# Patient Record
Sex: Female | Born: 1945 | Race: Black or African American | Hispanic: No | Smoking: Former smoker
Health system: Southern US, Community
[De-identification: ages and names within clinical notes are randomized; demographics above are authoritative.]

## PROBLEM LIST (undated history)

## (undated) DIAGNOSIS — M1990 Unspecified osteoarthritis, unspecified site: Secondary | ICD-10-CM

## (undated) DIAGNOSIS — J45909 Unspecified asthma, uncomplicated: Secondary | ICD-10-CM

## (undated) DIAGNOSIS — I10 Essential (primary) hypertension: Secondary | ICD-10-CM

## (undated) DIAGNOSIS — Z5189 Encounter for other specified aftercare: Secondary | ICD-10-CM

## (undated) DIAGNOSIS — E119 Type 2 diabetes mellitus without complications: Secondary | ICD-10-CM

## (undated) HISTORY — PX: APPENDECTOMY: SHX54

## (undated) HISTORY — PX: CHOLECYSTECTOMY: SHX55

## (undated) HISTORY — PX: HAND SURGERY: SHX662

## (undated) HISTORY — PX: KNEE SURGERY: SHX244

## (undated) HISTORY — PX: ABDOMINAL HYSTERECTOMY: SHX81

---

## 2007-02-06 ENCOUNTER — Emergency Department: Payer: Self-pay

## 2014-05-20 ENCOUNTER — Emergency Department (HOSPITAL_BASED_OUTPATIENT_CLINIC_OR_DEPARTMENT_OTHER): Payer: Medicare Other

## 2014-05-20 ENCOUNTER — Encounter (HOSPITAL_BASED_OUTPATIENT_CLINIC_OR_DEPARTMENT_OTHER): Payer: Self-pay | Admitting: *Deleted

## 2014-05-20 ENCOUNTER — Emergency Department (HOSPITAL_BASED_OUTPATIENT_CLINIC_OR_DEPARTMENT_OTHER)
Admission: EM | Admit: 2014-05-20 | Discharge: 2014-05-20 | Disposition: A | Discharge status: Home / Self Care | Payer: Medicare Other | Attending: Emergency Medicine | Admitting: Emergency Medicine

## 2014-05-20 DIAGNOSIS — Z9889 Other specified postprocedural states: Secondary | ICD-10-CM | POA: Insufficient documentation

## 2014-05-20 DIAGNOSIS — I10 Essential (primary) hypertension: Secondary | ICD-10-CM | POA: Diagnosis not present

## 2014-05-20 DIAGNOSIS — J45909 Unspecified asthma, uncomplicated: Secondary | ICD-10-CM | POA: Diagnosis not present

## 2014-05-20 DIAGNOSIS — Z7901 Long term (current) use of anticoagulants: Secondary | ICD-10-CM | POA: Diagnosis not present

## 2014-05-20 DIAGNOSIS — M25562 Pain in left knee: Secondary | ICD-10-CM | POA: Diagnosis present

## 2014-05-20 DIAGNOSIS — Z79899 Other long term (current) drug therapy: Secondary | ICD-10-CM | POA: Diagnosis not present

## 2014-05-20 DIAGNOSIS — Z87891 Personal history of nicotine dependence: Secondary | ICD-10-CM | POA: Insufficient documentation

## 2014-05-20 DIAGNOSIS — R52 Pain, unspecified: Secondary | ICD-10-CM

## 2014-05-20 DIAGNOSIS — M1712 Unilateral primary osteoarthritis, left knee: Secondary | ICD-10-CM

## 2014-05-20 DIAGNOSIS — M1990 Unspecified osteoarthritis, unspecified site: Secondary | ICD-10-CM | POA: Insufficient documentation

## 2014-05-20 HISTORY — DX: Encounter for other specified aftercare: Z51.89

## 2014-05-20 HISTORY — DX: Unspecified asthma, uncomplicated: J45.909

## 2014-05-20 HISTORY — DX: Essential (primary) hypertension: I10

## 2014-05-20 HISTORY — DX: Unspecified osteoarthritis, unspecified site: M19.90

## 2014-05-20 MED ORDER — METHOCARBAMOL 500 MG PO TABS: 500.0000 mg | ORAL_TABLET | Freq: Two times a day (BID) | ORAL | Status: DC | PRN

## 2014-05-20 MED ORDER — ONDANSETRON 4 MG PO TBDP
ORAL_TABLET | ORAL | Status: AC
  Administered 2014-05-20: 4 mg via ORAL
  Filled 2014-05-20: qty 1

## 2014-05-20 MED ORDER — ONDANSETRON 4 MG PO TBDP
4.0000 mg | ORAL_TABLET | Freq: Once | ORAL | Status: AC
  Administered 2014-05-20: 4 mg via ORAL

## 2014-05-20 MED ORDER — HYDROMORPHONE HCL 1 MG/ML IJ SOLN
1.0000 mg | Freq: Once | INTRAMUSCULAR | Status: AC
  Administered 2014-05-20: 1 mg via INTRAMUSCULAR
  Filled 2014-05-20: qty 1

## 2014-05-20 NOTE — Discharge Instructions (Signed)
Please call your doctor for a followup appointment within 24-48 hours. When you talk to your doctor please let them know that you were seen in the emergency department and have them acquire all of your records so that they can discuss the findings with you and formulate a treatment plan to fully care for your new and ongoing problems. ° °

## 2014-05-20 NOTE — ED Notes (Signed)
D/c home with family- directed to pharmacy to pick up prescriptions

## 2014-05-20 NOTE — ED Notes (Signed)
MD at bedside. 

## 2014-05-20 NOTE — ED Notes (Signed)
Patient assisted into bed.

## 2014-05-20 NOTE — ED Notes (Signed)
Left knee scope surgery on March 3- reports pain has persisted since post-op day 3- pt states her surgeon told her to get a nerve block- pt attempted to follow through with this and was told that wasn't what she needed. Pt reports she wanted to "saw her leg off last night"- Surgery was in [place]

## 2014-05-20 NOTE — ED Notes (Signed)
Patient transported to X-ray 

## 2014-05-20 NOTE — ED Provider Notes (Signed)
CSN: [id number]     Arrival date & time 05/20/14  [id number] History   First MD Initiated Contact with Patient 05/20/14 [phone]     Chief Complaint  Patient presents with  . Knee Pain     (Consider location/radiation/quality/duration/timing/severity/associated sxs/prior Treatment) HPI Comments: The patient is a 69 year old female, she is approximately 1 month postop from an arthroscopic surgery of her left knee where she was told that she had meniscus injuries and this was partially resected according to the patient's report. Ever since 3 days after surgery she has had ongoing pain in that knee, it is particularly intense when she tries to change position including getting up to walk or walking. There is minimal pain when she is at rest. The pain is located behind the knee and radiates anteriorly to just below the patella. 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 She denies fevers chills nausea vomiting redness or swelling of the knee. Her orthopedic surgeon is in [place]. She has follow-up in 8 days  Patient is a 69 y.o. female presenting with knee pain. The history is provided by the patient.  Knee Pain   Past Medical History  Diagnosis Date  . Hypertension   . Arthritis   . Asthma   . Blood transfusion without reported diagnosis    Past Surgical History  Procedure Laterality Date  . Knee surgery Bilateral   . Abdominal hysterectomy    . Cholecystectomy    . Appendectomy    . Hand surgery     No family history on file. History  Substance Use Topics  . Smoking status: Former Research scientist (life sciences)  . Smokeless tobacco: Never Used  . Alcohol Use: No   OB History    No data available     Review of Systems  All other systems reviewed and are negative.     Allergies  Actonel; Codeine; and  Talwin  Home Medications   Prior to Admission medications   Medication Sig Start Date End Date Taking? Authorizing Provider  amLODipine (NORVASC) 5 MG tablet Take 5 mg by mouth daily.   Yes Historical Provider, MD  atorvastatin (LIPITOR) 10 MG tablet Take 10 mg by mouth daily.   Yes Historical Provider, MD  Calcium Carbonate-Vitamin D (CALCIUM + D PO) Take by mouth.   Yes Historical Provider, MD  cetirizine (ZYRTEC) 10 MG tablet Take 10 mg by mouth daily.   Yes Historical Provider, MD  chlorzoxazone (PARAFON) 500 MG tablet Take 500 mg by mouth 4 (four) times daily as needed for muscle spasms.   Yes Historical Provider, MD  cholecalciferol (VITAMIN D) 1000 UNITS tablet Take 1,000 Units by mouth daily.   Yes Historical Provider, MD  cyclobenzaprine (FLEXERIL) 10 MG tablet Take 10 mg by mouth 3 (three) times daily as needed for muscle spasms.   Yes Historical Provider, MD  docusate sodium (COLACE) 100 MG capsule Take 100 mg by mouth 2 (two) times daily.   Yes Historical Provider, MD  gabapentin (NEURONTIN) 300 MG capsule Take 300 mg by mouth 3 (three) times daily.   Yes Historical Provider, MD  IRON PO Take 65 mg by mouth.   Yes Historical Provider, MD  methylPREDNISolone (MEDROL) 4 MG tablet Take 4 mg by mouth daily.   Yes Historical Provider, MD  Multiple Vitamin (  MULTIVITAMIN) tablet Take 1 tablet by mouth daily.   Yes Historical Provider, MD  oxyCODONE-acetaminophen (PERCOCET/ROXICET) 5-325 MG per tablet Take 2 tablets by mouth every 6 (six) hours as needed for severe pain.   Yes Historical Provider, MD  pantoprazole (PROTONIX) 40 MG tablet Take 40 mg by mouth daily.   Yes Historical Provider, MD  valsartan-hydrochlorothiazide (DIOVAN-HCT) 160-25 MG per tablet Take 1 tablet by mouth daily.   Yes Historical Provider, MD  warfarin (COUMADIN) 5 MG tablet Take 5 mg by mouth daily.   Yes Historical Provider, MD  methocarbamol (ROBAXIN) 500 MG tablet Take 1 tablet (500 mg total) by mouth 2 (two)  times daily as needed for muscle spasms. 05/20/14   Noemi Chapel, MD   BP 140/84 mmHg  Pulse 74  Temp(Src) 98.2 F (36.8 C) (Oral)  Resp 20  Ht 5\' 2"  (1.575 m)  Wt 250 lb (113.399 kg)  BMI 45.71 kg/m2  SpO2 99% Physical Exam  Constitutional: She appears well-developed and well-nourished. No distress.  HENT:  Head: Normocephalic and atraumatic.  Mouth/Throat: Oropharynx is clear and moist. No oropharyngeal exudate.  Eyes: Conjunctivae and EOM are normal. Pupils are equal, round, and reactive to light. Right eye exhibits no discharge. Left eye exhibits no discharge. No scleral icterus.  Neck: Normal range of motion. Neck supple. No JVD present. No thyromegaly present.  Cardiovascular: Normal rate, regular rhythm, normal heart sounds and intact distal pulses.  Exam reveals no gallop and no friction rub.   No murmur heard. Pulmonary/Chest: Effort normal and breath sounds normal. No respiratory distress. She has no wheezes. She has no rales.  Abdominal: Soft. Bowel sounds are normal. She exhibits no distension and no mass. There is no tenderness.  Musculoskeletal: Normal range of motion. She exhibits tenderness ( Minimal pain until the patient has to straight leg raise and bend her knee spontaneously. There is no crepitance palpated). She exhibits no edema.  No tenderness over the patella, the patellar tendon or the distal quadriceps. Able to straight leg raise, no tenderness over the calf, no asymmetry of the lower extremities, no warmth redness or obvious joint effusion  Lymphadenopathy:    She has no cervical adenopathy.  Neurological: She is alert. Coordination normal.  Skin: Skin is warm and dry. No rash noted. No erythema.  Psychiatric: She has a normal mood and affect. Her behavior is normal.  Nursing note and vitals reviewed.   ED Course  Procedures (including critical care time) Labs Review Labs Reviewed - No data to display  Imaging Review Dg Knee Complete 4 Views  Left  05/20/2014   CLINICAL DATA:  Persistent pain since postoperative day 3 from LEFT knee arthroscopy on 04/18/2014  EXAM: LEFT KNEE - COMPLETE 4+ VIEW  COMPARISON:  None  FINDINGS: Osseous demineralization.  Tricompartmental osteoarthritic changes with joint space narrowing and spur formation.  Mild lateral subluxation of tibia.  No acute fracture, dislocation, or bone destruction.  No knee joint effusion.  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   Electronically Signed   By: Lavonia Dana M.D.   On: 05/20/2014 10:10     MDM   Final diagnoses:  Pain  Arthritis of left knee    The patient does have decreased range of motion secondary to pain, she does not appear to have a septic joint, vital signs are normal, pain has been present for over 3 weeks, image, intramuscular Dilaudid 1, can follow up with surgeon if negative, likely needs repeat orthopedic evaluation.  Significant arthritis but no other acute findings, patient stable for discharge on her medications that she is already taking. Vital signs normal, doubt septic joint  Meds given in ED:  Medications  HYDROmorphone (DILAUDID) injection 1 mg (1 mg Intramuscular Given 05/20/14 0940)  ondansetron (ZOFRAN-ODT) disintegrating tablet 4 mg (4 mg Oral Given 05/20/14 0937)    New Prescriptions   METHOCARBAMOL (ROBAXIN) 500 MG TABLET    Take 1 tablet (500 mg total) by mouth 2 (two) times daily as needed for muscle spasms.      Noemi Chapel, MD 05/20/14 1022

## 2014-05-20 NOTE — ED Notes (Signed)
Patient to triage via wheelchair, EMT asked multiple visitors to remain in lobby at this time.

## 2015-10-24 DIAGNOSIS — D509 Iron deficiency anemia, unspecified: Secondary | ICD-10-CM | POA: Insufficient documentation

## 2015-10-24 DIAGNOSIS — R011 Cardiac murmur, unspecified: Secondary | ICD-10-CM | POA: Insufficient documentation

## 2015-12-02 DIAGNOSIS — Z7901 Long term (current) use of anticoagulants: Secondary | ICD-10-CM | POA: Insufficient documentation

## 2015-12-02 DIAGNOSIS — Z86711 Personal history of pulmonary embolism: Secondary | ICD-10-CM | POA: Insufficient documentation

## 2015-12-23 DIAGNOSIS — M4727 Other spondylosis with radiculopathy, lumbosacral region: Secondary | ICD-10-CM | POA: Insufficient documentation

## 2015-12-23 DIAGNOSIS — G8929 Other chronic pain: Secondary | ICD-10-CM | POA: Insufficient documentation

## 2016-06-01 DIAGNOSIS — Z86010 Personal history of colonic polyps: Secondary | ICD-10-CM | POA: Insufficient documentation

## 2016-06-27 ENCOUNTER — Encounter (HOSPITAL_COMMUNITY): Payer: Self-pay

## 2016-06-27 ENCOUNTER — Emergency Department (HOSPITAL_COMMUNITY): Payer: Medicare Other

## 2016-06-27 ENCOUNTER — Emergency Department (HOSPITAL_COMMUNITY)
Admission: EM | Admit: 2016-06-27 | Discharge: 2016-06-28 | Disposition: A | Discharge status: Home / Self Care | Payer: Medicare Other | Attending: Emergency Medicine | Admitting: Emergency Medicine

## 2016-06-27 DIAGNOSIS — J45909 Unspecified asthma, uncomplicated: Secondary | ICD-10-CM | POA: Insufficient documentation

## 2016-06-27 DIAGNOSIS — R1010 Upper abdominal pain, unspecified: Secondary | ICD-10-CM | POA: Insufficient documentation

## 2016-06-27 DIAGNOSIS — Z79899 Other long term (current) drug therapy: Secondary | ICD-10-CM | POA: Diagnosis not present

## 2016-06-27 DIAGNOSIS — I10 Essential (primary) hypertension: Secondary | ICD-10-CM | POA: Insufficient documentation

## 2016-06-27 DIAGNOSIS — R079 Chest pain, unspecified: Secondary | ICD-10-CM | POA: Diagnosis present

## 2016-06-27 DIAGNOSIS — Z87891 Personal history of nicotine dependence: Secondary | ICD-10-CM | POA: Insufficient documentation

## 2016-06-27 LAB — I-STAT TROPONIN, ED: Troponin i, poc: 0.02 ng/mL (ref 0.00–0.08)

## 2016-06-27 LAB — HEPATIC FUNCTION PANEL
ALK PHOS: 51 U/L (ref 38–126)
ALT: 14 U/L (ref 14–54)
AST: 20 U/L (ref 15–41)
Albumin: 3.5 g/dL (ref 3.5–5.0)
BILIRUBIN TOTAL: 0.3 mg/dL (ref 0.3–1.2)
TOTAL PROTEIN: 7.2 g/dL (ref 6.5–8.1)

## 2016-06-27 LAB — BASIC METABOLIC PANEL
Anion gap: 11 (ref 5–15)
BUN: 36 mg/dL — ABNORMAL HIGH (ref 6–20)
CHLORIDE: 110 mmol/L (ref 101–111)
CO2: 22 mmol/L (ref 22–32)
Calcium: 9 mg/dL (ref 8.9–10.3)
Creatinine, Ser: 1.86 mg/dL — ABNORMAL HIGH (ref 0.44–1.00)
GFR calc Af Amer: 30 mL/min — ABNORMAL LOW (ref >60)
GFR calc non Af Amer: 26 mL/min — ABNORMAL LOW (ref >60)
Glucose, Bld: 128 mg/dL — ABNORMAL HIGH (ref 65–99)
Potassium: 3.3 mmol/L — ABNORMAL LOW (ref 3.5–5.1)
Sodium: 143 mmol/L (ref 135–145)

## 2016-06-27 LAB — CBC
HCT: 33.5 % — ABNORMAL LOW (ref 36.0–46.0)
Hemoglobin: 10.5 g/dL — ABNORMAL LOW (ref 12.0–15.0)
MCH: 21.6 pg — ABNORMAL LOW (ref 26.0–34.0)
MCHC: 31.3 g/dL (ref 30.0–36.0)
MCV: 68.8 fL — AB (ref 78.0–100.0)
PLATELETS: 282 10*3/uL (ref 150–400)
RBC: 4.87 MIL/uL (ref 3.87–5.11)
RDW: 18.2 % — AB (ref 11.5–15.5)
WBC: 7.8 10*3/uL (ref 4.0–10.5)

## 2016-06-27 LAB — LIPASE, BLOOD: LIPASE: 27 U/L (ref 11–51)

## 2016-06-27 MED ORDER — HYDROMORPHONE HCL 1 MG/ML IJ SOLN: 1.0000 mg | Freq: Once | INTRAMUSCULAR | Status: DC

## 2016-06-27 MED ORDER — ONDANSETRON 4 MG PO TBDP: 4.0000 mg | ORAL_TABLET | Freq: Once | ORAL | Status: DC

## 2016-06-27 MED ORDER — HYDROCODONE-ACETAMINOPHEN 5-325 MG PO TABS: 1.0000 | ORAL_TABLET | Freq: Four times a day (QID) | ORAL | 0 refills | Status: DC | PRN

## 2016-06-27 NOTE — ED Notes (Signed)
Looked and appropriate tube in lab to add on function test

## 2016-06-27 NOTE — ED Notes (Signed)
Unable to find vein to stick for blood draw/ IV start

## 2016-06-27 NOTE — ED Notes (Signed)
Sent add on label to lab for add on blood test

## 2016-06-27 NOTE — Discharge Instructions (Signed)
Increase her Protonix to twice a day. Call your family doctor and find out when you're somewhat with the GI doctors going to be

## 2016-06-27 NOTE — ED Triage Notes (Signed)
Pt from home with epi gastric pain that started today. Pt was seen by PCP and referred to GI MD but has not seen one yet. Pt rates PAIN 6-10 and is oriented x4. no SOB.

## 2016-06-27 NOTE — ED Notes (Signed)
MD cleared patient to have water. Water provided

## 2016-06-27 NOTE — ED Notes (Signed)
Pt at Xray, phlebotomist came to draw labs.

## 2016-06-28 NOTE — ED Notes (Signed)
Pt walked out of the room and RN asked patient if she wanted her discharge paper work. Pt stated "you can mail it to me." RN also informed patient and family that she had pain medication ordered. She continued to walk

## 2016-06-28 NOTE — ED Notes (Signed)
Pt left prior to RN reviewing them. RN informed family and patient as they were leaving they had pain and nausea medication ordered. Pt stated we could mail the papers to her and she kept walking

## 2016-06-28 NOTE — ED Provider Notes (Signed)
[facility] DEPT Provider Note   CSN: [id number] Arrival date & time: 06/27/16  1759     History   Chief Complaint Chief Complaint  Patient presents with  . Chest Pain    HPI Katrina Brady is a 71 y.o. female.  Patient complains of epigastric abdominal pain   The history is provided by the patient.  Chest Pain   This is a recurrent problem. The current episode started more than 1 week ago. The problem occurs constantly. The problem has not changed since onset.The pain is associated with rest. The pain is present in the epigastric region. The pain is at a severity of 6/10. The pain is moderate. The quality of the pain is described as burning. Pertinent negatives include no abdominal pain, no back pain, no cough and no headaches.  Pertinent negatives for past medical history include no seizures.    Past Medical History:  Diagnosis Date  . Arthritis   . Asthma   . Blood transfusion without reported diagnosis   . Hypertension     There are no active problems to display for this patient.   Past Surgical History:  Procedure Laterality Date  . ABDOMINAL HYSTERECTOMY    . APPENDECTOMY    . CHOLECYSTECTOMY    . HAND SURGERY    . KNEE SURGERY Bilateral     OB History    No data available       Home Medications    Prior to Admission medications   Medication Sig Start Date End Date Taking? Authorizing Provider  acetaminophen (TYLENOL) 500 MG tablet Take 1,000 mg by mouth every 6 (six) hours as needed for headache (pain).   Yes [provider]  amLODipine (NORVASC) 5 MG tablet Take 5 mg by mouth daily.   Yes [provider]  Bioflavonoid Products (VITAMIN C) CHEW Chew 2 tablets by mouth daily at 3 pm.   Yes [provider]  Calcium Carb-Cholecalciferol (CALCIUM 600-D PO) Take 600 mg by mouth 2 (two) times daily.   Yes [provider]  cetirizine (ZYRTEC) 10 MG tablet Take 10 mg by mouth at bedtime.    Yes [provider]    Cholecalciferol (VITAMIN D-3) 5000 units TABS Take 5,000 Units by mouth daily.   Yes [provider]  docusate sodium (COLACE) 100 MG capsule Take 100 mg by mouth at bedtime.    Yes [provider]  ferrous sulfate 325 (65 FE) MG tablet Take 325 mg by mouth See admin instructions. Take 1 tablet (325 mg) by mouth Monday, Wednesday, Friday - mid afternoon (3pm)   Yes [provider]  Multiple Vitamin (MULTIVITAMIN WITH MINERALS) TABS tablet Take 1 tablet by mouth daily.   Yes [provider]  pantoprazole (PROTONIX) 40 MG tablet Take 40 mg by mouth daily before breakfast.    Yes [provider]  potassium chloride SA (K-DUR,KLOR-CON) 20 MEQ tablet Take 20 mEq by mouth at bedtime.   Yes [provider]  Probiotic Product (RA PROBIOTIC GUMMIES PO) Take 2 tablets by mouth daily at 12 noon.   Yes [provider]  rosuvastatin (CRESTOR) 40 MG tablet Take 40 mg by mouth daily. 06/19/16  Yes [provider]  traMADol (ULTRAM) 50 MG tablet Take 50 mg by mouth 3 (three) times daily as needed (pain).  06/22/16  Yes [provider]  valsartan-hydrochlorothiazide (DIOVAN-HCT) 320-25 MG tablet Take 1 tablet by mouth daily. 06/17/16  Yes [provider]  warfarin (COUMADIN) 5 MG tablet  Take 5-7.5 mg by mouth See admin instructions. Take 1 1/2 tablets (7.5 mg) by mouth Tuesday and Thursday after supper, take 1 tablet (5 mg) on Sunday, Monday, Wednesday, Friday, Saturday after supper   Yes [provider]  HYDROcodone-acetaminophen (NORCO/VICODIN) 5-325 MG tablet Take 1 tablet by mouth every 6 (six) hours as needed for moderate pain. 06/27/16   Milton Ferguson, MD  methocarbamol (ROBAXIN) 500 MG tablet Take 1 tablet (500 mg total) by mouth 2 (two) times daily as needed for muscle spasms. Patient not taking: Reported on 06/27/2016 05/20/14   Noemi Chapel, MD    Family History History reviewed. No pertinent family  history.  Social History Social History  Substance Use Topics  . Smoking status: Former Research scientist (life sciences)  . Smokeless tobacco: Never Used  . Alcohol use No     Allergies   Actonel [risedronate sodium]; Codeine; Meloxicam; and Talwin [pentazocine]   Review of Systems Review of Systems  Constitutional: Negative for appetite change and fatigue.  HENT: Negative for congestion, ear discharge and sinus pressure.   Eyes: Negative for discharge.  Respiratory: Negative for cough.   Cardiovascular: Positive for chest pain.  Gastrointestinal: Negative for abdominal pain and diarrhea.  Genitourinary: Negative for frequency and hematuria.  Musculoskeletal: Negative for back pain.  Skin: Negative for rash.  Neurological: Negative for seizures and headaches.  Psychiatric/Behavioral: Negative for hallucinations.     Physical Exam Updated Vital Signs BP 118/71 (BP Location: Right Arm)   Pulse 72   Temp 97.9 F (36.6 C) (Oral)   Resp 16   Ht 5\' 3"  (1.6 m)   Wt 273 lb (123.8 kg)   SpO2 100%   BMI 48.36 kg/m   Physical Exam  Constitutional: She is oriented to person, place, and time. She appears well-developed.  HENT:  Head: Normocephalic.  Eyes: Conjunctivae and EOM are normal. No scleral icterus.  Neck: Neck supple. No thyromegaly present.  Cardiovascular: Normal rate and regular rhythm.  Exam reveals no gallop and no friction rub.   No murmur heard. Pulmonary/Chest: No stridor. She has no wheezes. She has no rales. She exhibits no tenderness.  Abdominal: She exhibits no distension. There is tenderness. There is no rebound.  Musculoskeletal: Normal range of motion. She exhibits no edema.  Lymphadenopathy:    She has no cervical adenopathy.  Neurological: She is oriented to person, place, and time. She exhibits normal muscle tone. Coordination normal.  Skin: No rash noted. No erythema.  Psychiatric: She has a normal mood and affect. Her behavior is normal.     ED Treatments /  Results  Labs (all labs ordered are listed, but only abnormal results are displayed) Labs Reviewed  BASIC METABOLIC PANEL - Abnormal; Notable for the following:       Result Value   Potassium 3.3 (*)    Glucose, Bld 128 (*)    BUN 36 (*)    Creatinine, Ser 1.86 (*)    GFR calc non Af Amer 26 (*)    GFR calc Af Amer 30 (*)    All other components within normal limits  CBC - Abnormal; Notable for the following:    Hemoglobin 10.5 (*)    HCT 33.5 (*)    MCV 68.8 (*)    MCH 21.6 (*)    RDW 18.2 (*)    All other components within normal limits  HEPATIC FUNCTION PANEL - Abnormal; Notable for the following:    Bilirubin, Direct <0.1 (*)    All other components  within normal limits  LIPASE, BLOOD  I-STAT TROPOININ, ED    EKG  EKG Interpretation  Date/Time:  Sunday Jun 27 2016 18:07:26 EDT Ventricular Rate:  84 PR Interval:    QRS Duration: 119 QT Interval:  391 QTC Calculation: 463 R Axis:   -21 Text Interpretation:  Sinus rhythm Probable left atrial enlargement Left ventricular hypertrophy Confirmed by Akeelah Seppala  MD, Broadus John [phone]) on 06/27/2016 7:01:14 PM Also confirmed by Elizar Alpern  MD, Broadus John [phone]), editor Drema Pry [phone])  on 06/28/2016 7:31:48 AM       Radiology Dg Chest 2 View  Result Date: 06/27/2016 CLINICAL DATA:  Initial evaluation for acute chest pain. EXAM: CHEST  2 VIEW COMPARISON:  Prior radiograph from 02/16/2010. FINDINGS: The cardiac and mediastinal silhouettes are stable in size and contour, and remain within normal limits. The lungs are normally inflated. No airspace consolidation, pleural effusion, or pulmonary edema is identified. There is no pneumothorax. No acute osseous abnormality identified. IMPRESSION: No active cardiopulmonary disease. Electronically Signed   By: Jeannine Boga M.D.   On: 06/27/2016 19:09    Procedures Procedures (including critical care time)  Medications Ordered in ED Medications - No data to display   Initial  Impression / Assessment and Plan / ED Course  I have reviewed the triage vital signs and the nursing notes.  Pertinent labs & imaging results that were available during my care of the patient were reviewed by me and considered in my medical decision making (see chart for details).     Suspect abdominal pain is related to peptic ulcer problems. She is going to increase her protein pump inhibitor and she will be followed up by GI  Final Clinical Impressions(s) / ED Diagnoses   Final diagnoses:  Pain of upper abdomen    New Prescriptions Discharge Medication List as of 06/27/2016 11:45 PM    START taking these medications   Details  HYDROcodone-acetaminophen (NORCO/VICODIN) 5-325 MG tablet Take 1 tablet by mouth every 6 (six) hours as needed for moderate pain., Starting Sun 06/27/2016, Print         Milton Ferguson, MD 06/28/16 [phone]

## 2016-11-29 DIAGNOSIS — R1013 Epigastric pain: Secondary | ICD-10-CM | POA: Insufficient documentation

## 2016-11-29 DIAGNOSIS — I82503 Chronic embolism and thrombosis of unspecified deep veins of lower extremity, bilateral: Secondary | ICD-10-CM | POA: Insufficient documentation

## 2016-11-30 DIAGNOSIS — N1832 Chronic kidney disease, stage 3b: Secondary | ICD-10-CM | POA: Insufficient documentation

## 2016-12-16 ENCOUNTER — Encounter: Payer: Self-pay | Admitting: Emergency Medicine

## 2016-12-16 ENCOUNTER — Emergency Department: Payer: Medicare Other

## 2016-12-16 ENCOUNTER — Emergency Department
Admission: EM | Admit: 2016-12-16 | Discharge: 2016-12-16 | Disposition: A | Discharge status: Home / Self Care | Payer: Medicare Other | Attending: Emergency Medicine | Admitting: Emergency Medicine

## 2016-12-16 DIAGNOSIS — Z7901 Long term (current) use of anticoagulants: Secondary | ICD-10-CM | POA: Insufficient documentation

## 2016-12-16 DIAGNOSIS — R110 Nausea: Secondary | ICD-10-CM | POA: Insufficient documentation

## 2016-12-16 DIAGNOSIS — R059 Cough, unspecified: Secondary | ICD-10-CM

## 2016-12-16 DIAGNOSIS — I10 Essential (primary) hypertension: Secondary | ICD-10-CM | POA: Insufficient documentation

## 2016-12-16 DIAGNOSIS — Z87891 Personal history of nicotine dependence: Secondary | ICD-10-CM | POA: Insufficient documentation

## 2016-12-16 DIAGNOSIS — R05 Cough: Secondary | ICD-10-CM | POA: Diagnosis not present

## 2016-12-16 DIAGNOSIS — R079 Chest pain, unspecified: Secondary | ICD-10-CM | POA: Insufficient documentation

## 2016-12-16 DIAGNOSIS — Z79899 Other long term (current) drug therapy: Secondary | ICD-10-CM | POA: Diagnosis not present

## 2016-12-16 DIAGNOSIS — J45909 Unspecified asthma, uncomplicated: Secondary | ICD-10-CM | POA: Diagnosis not present

## 2016-12-16 LAB — BASIC METABOLIC PANEL
ANION GAP: 12 (ref 5–15)
BUN: 32 mg/dL — ABNORMAL HIGH (ref 6–20)
CHLORIDE: 107 mmol/L (ref 101–111)
CO2: 20 mmol/L — AB (ref 22–32)
CREATININE: 1.59 mg/dL — AB (ref 0.44–1.00)
Calcium: 9.5 mg/dL (ref 8.9–10.3)
GFR calc non Af Amer: 31 mL/min — ABNORMAL LOW (ref >60)
GFR, EST AFRICAN AMERICAN: 36 mL/min — AB (ref >60)
Glucose, Bld: 103 mg/dL — ABNORMAL HIGH (ref 65–99)
POTASSIUM: 4.2 mmol/L (ref 3.5–5.1)
SODIUM: 139 mmol/L (ref 135–145)

## 2016-12-16 LAB — HEPATIC FUNCTION PANEL
ALBUMIN: 4 g/dL (ref 3.5–5.0)
ALK PHOS: 55 U/L (ref 38–126)
ALT: 17 U/L (ref 14–54)
AST: 25 U/L (ref 15–41)
Bilirubin, Direct: <0.1 mg/dL — ABNORMAL LOW (ref 0.1–0.5)
TOTAL PROTEIN: 7.9 g/dL (ref 6.5–8.1)
Total Bilirubin: 0.3 mg/dL (ref 0.3–1.2)

## 2016-12-16 LAB — CBC
HCT: 34.7 % — ABNORMAL LOW (ref 35.0–47.0)
Hemoglobin: 10.9 g/dL — ABNORMAL LOW (ref 12.0–16.0)
MCH: 22.2 pg — ABNORMAL LOW (ref 26.0–34.0)
MCHC: 31.5 g/dL — ABNORMAL LOW (ref 32.0–36.0)
MCV: 70.7 fL — AB (ref 80.0–100.0)
PLATELETS: 245 10*3/uL (ref 150–440)
RBC: 4.91 MIL/uL (ref 3.80–5.20)
RDW: 18.1 % — ABNORMAL HIGH (ref 11.5–14.5)
WBC: 5 10*3/uL (ref 3.6–11.0)

## 2016-12-16 LAB — PROTIME-INR
INR: 2.68
PROTHROMBIN TIME: 28.3 s — AB (ref 11.4–15.2)

## 2016-12-16 LAB — LIPASE, BLOOD: LIPASE: 35 U/L (ref 11–51)

## 2016-12-16 LAB — TROPONIN I: Troponin I: <0.03 ng/mL (ref <0.03)

## 2016-12-16 LAB — APTT: aPTT: 52 seconds — ABNORMAL HIGH (ref 24–36)

## 2016-12-16 MED ORDER — IOPAMIDOL (ISOVUE-370) INJECTION 76%
60.0000 mL | Freq: Once | INTRAVENOUS | Status: AC | PRN
  Administered 2016-12-16: 60 mL via INTRAVENOUS

## 2016-12-16 MED ORDER — MORPHINE SULFATE (PF) 4 MG/ML IV SOLN
4.0000 mg | Freq: Once | INTRAVENOUS | Status: AC
  Administered 2016-12-16: 4 mg via INTRAVENOUS
  Filled 2016-12-16: qty 1

## 2016-12-16 MED ORDER — ONDANSETRON HCL 4 MG/2ML IJ SOLN
4.0000 mg | Freq: Once | INTRAMUSCULAR | Status: AC
  Administered 2016-12-16: 4 mg via INTRAVENOUS
  Filled 2016-12-16: qty 2

## 2016-12-16 MED ORDER — SODIUM CHLORIDE 0.9 % IV BOLUS (SEPSIS)
500.0000 mL | Freq: Once | INTRAVENOUS | Status: AC
  Administered 2016-12-16: 500 mL via INTRAVENOUS

## 2016-12-16 NOTE — ED Notes (Signed)
Pt in xr and labs drawn in triage.

## 2016-12-16 NOTE — ED Provider Notes (Signed)
-----------------------------------------   10:34 PM on 12/16/2016 -----------------------------------------  Patient's repeat troponin is negative.  CT scan of the chest is negative.  Patient labs are largely at baseline including renal function.  Therapeutic INR.  Patient will be discharged home with PCP and cardiology follow-up.   Harvest Dark, MD 12/16/16 2235

## 2016-12-16 NOTE — ED Provider Notes (Signed)
[facility] Emergency Department Provider Note  ____________________________________________  Time seen: Approximately 6:14 PM  I have reviewed the triage vital signs and the nursing notes.   HISTORY  Chief Complaint Chest Pain   HPI Katrina Brady is a 71 y.o. 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 Patient reports that she has been having intermittent chest pain for the last week. She reports that the pain is squeezing, located in the center of her chest, has been constant all day today and severe. She is feeling short of breath. The pain is worse with breathing. Reports it feels similar to her prior PEs. Patient's warfarin has been subtherapeutic and patient is currently on Lovenox shots. She endorses compliance with that. She denies leg pain or swelling or hemoptysis. She also has a dry cough and chills but no fever. Patient has had significant nausea but no vomiting. No diarrhea. Also complaining of epigastric abdominal pain radiating to her back.no personal or family history of heart attacks. Patient is not a smoker.  Past Medical History:  Diagnosis Date  . Arthritis   . Asthma   . Blood transfusion without reported diagnosis   . Hypertension     There are no active problems to display for this patient.   Past Surgical History:  Procedure Laterality Date  . ABDOMINAL HYSTERECTOMY    . APPENDECTOMY    . CHOLECYSTECTOMY    . HAND SURGERY    . KNEE SURGERY Bilateral     Prior to Admission medications   Medication Sig Start Date End Date Taking? Authorizing Provider  acetaminophen (TYLENOL) 500 MG tablet Take 1,000 mg by mouth every 6 (six) hours as needed for headache (pain).    [provider]  amLODipine (NORVASC) 5 MG tablet Take 5 mg by mouth daily.    [provider]  Bioflavonoid  Products (VITAMIN C) CHEW Chew 2 tablets by mouth daily at 3 pm.    [provider]  Calcium Carb-Cholecalciferol (CALCIUM 600-D PO) Take 600 mg by mouth 2 (two) times daily.    [provider]  cetirizine (ZYRTEC) 10 MG tablet Take 10 mg by mouth at bedtime.     [provider]  Cholecalciferol (VITAMIN D-3) 5000 units TABS Take 5,000 Units by mouth daily.    [provider]  docusate sodium (COLACE) 100 MG capsule Take 100 mg by mouth at bedtime.     [provider]  ferrous sulfate 325 (65 FE) MG tablet Take 325 mg by mouth See admin instructions. Take 1 tablet (325 mg) by mouth Monday, Wednesday, Friday - mid afternoon (3pm)    [provider]  HYDROcodone-acetaminophen (NORCO/VICODIN) 5-325 MG tablet Take 1 tablet by mouth every 6 (six) hours as needed for moderate pain. 06/27/16   Milton Ferguson, MD  methocarbamol (ROBAXIN) 500 MG tablet Take 1 tablet (500 mg total) by mouth 2 (two) times daily as needed for muscle spasms. Patient not taking: Reported on 06/27/2016 05/20/14   Noemi Chapel, MD  Multiple Vitamin (MULTIVITAMIN WITH MINERALS) TABS tablet Take 1 tablet by mouth daily.    [provider]  pantoprazole (PROTONIX) 40 MG tablet Take 40 mg by mouth daily before breakfast.     [provider]  potassium chloride SA (K-DUR,KLOR-CON) 20 MEQ tablet Take 20 mEq by mouth at bedtime.    [provider]  Probiotic  Product (RA PROBIOTIC GUMMIES PO) Take 2 tablets by mouth daily at 12 noon.    [provider]  rosuvastatin (CRESTOR) 40 MG tablet Take 40 mg by mouth daily. 06/19/16   [provider]  traMADol (ULTRAM) 50 MG tablet Take 50 mg by mouth 3 (three) times daily as needed (pain).  06/22/16   [provider]  valsartan-hydrochlorothiazide (DIOVAN-HCT) 320-25 MG tablet Take 1 tablet by mouth daily. 06/17/16   [provider]  warfarin (COUMADIN) 5 MG tablet Take 5-7.5 mg by mouth See  admin instructions. Take 1 1/2 tablets (7.5 mg) by mouth Tuesday and Thursday after supper, take 1 tablet (5 mg) on Sunday, Monday, Wednesday, Friday, Saturday after supper    [provider]    Allergies Actonel [risedronate sodium]; Codeine; Meloxicam; and Talwin [pentazocine]  No family history on file.  Social History Social History  Substance Use Topics  . Smoking status: Former Research scientist (life sciences)  . Smokeless tobacco: Never Used  . Alcohol use No    Review of Systems  Constitutional: Negative for fever. Eyes: Negative for visual changes. ENT: Negative for sore throat. Neck: No neck pain  Cardiovascular: + chest pain. Respiratory: + shortness of breath, cough Gastrointestinal: + epigastric abdominal pain and nausea. No vomiting or diarrhea. Genitourinary: Negative for dysuria. Musculoskeletal: Negative for back pain. Skin: Negative for rash. Neurological: Negative for headaches, weakness or numbness. Psych: No SI or HI  ____________________________________________   PHYSICAL EXAM:  VITAL SIGNS: ED Triage Vitals  Enc Vitals Group     BP 12/16/16 1722 118/65     Pulse Rate 12/16/16 1722 89     Resp 12/16/16 1722 18     Temp 12/16/16 1722 97.6 F (36.4 C)     Temp Source 12/16/16 1722 Oral     SpO2 12/16/16 1722 99 %     Weight 12/16/16 1724 275 lb (124.7 kg)     Height 12/16/16 1724 5\' 3"  (1.6 m)     Head Circumference --      Peak Flow --      Pain Score 12/16/16 1724 9     Pain Loc --      Pain Edu? --      Excl. in [place]? --     Constitutional: Alert and oriented. Well appearing and in no apparent distress. HEENT:      Head: Normocephalic and atraumatic.         Eyes: Conjunctivae are normal. Sclera is non-icteric.       Mouth/Throat: Mucous membranes are moist.       Neck: Supple with no signs of meningismus. Cardiovascular: Regular rate and rhythm. No murmurs, gallops, or rubs. 2+ symmetrical distal pulses are present in all extremities. No  JVD. Respiratory: Normal respiratory effort. Lungs are clear to auscultation bilaterally. No wheezes, crackles, or rhonchi.  Gastrointestinal: Obese, soft, mild ttp over the epigastric region, and non distended with positive bowel sounds. No rebound or guarding. Genitourinary: No CVA tenderness. Musculoskeletal: Nontender with normal range of motion in all extremities. No edema, cyanosis, or erythema of extremities. Neurologic: Normal speech and language. Face is symmetric. Moving all extremities. No gross focal neurologic deficits are appreciated. Skin: Skin is warm, dry and intact. No rash noted. Psychiatric: Mood and affect are normal. Speech and behavior are normal.  ____________________________________________   LABS (all labs ordered are listed, but only abnormal results are displayed)  Labs Reviewed  BASIC METABOLIC PANEL - Abnormal; Notable for the following:  Result Value   CO2 20 (*)    Glucose, Bld 103 (*)    BUN 32 (*)    Creatinine, Ser 1.59 (*)    GFR calc non Af Amer 31 (*)    GFR calc Af Amer 36 (*)    All other components within normal limits  CBC - Abnormal; Notable for the following:    Hemoglobin 10.9 (*)    HCT 34.7 (*)    MCV 70.7 (*)    MCH 22.2 (*)    MCHC 31.5 (*)    RDW 18.1 (*)    All other components within normal limits  APTT - Abnormal; Notable for the following:    aPTT 52 (*)    All other components within normal limits  HEPATIC FUNCTION PANEL - Abnormal; Notable for the following:    Bilirubin, Direct <0.1 (*)    All other components within normal limits  PROTIME-INR - Abnormal; Notable for the following:    Prothrombin Time 28.3 (*)    All other components within normal limits  TROPONIN I  LIPASE, BLOOD  TROPONIN I   ____________________________________________  EKG  ED ECG REPORT I, Rudene Re, the attending physician, personally viewed and interpreted this ECG.  Normal sinus rhythm, rate of 90, normal intervals, left  axis deviation, no ST elevations or depressions.unchanged from prior from 06/2016 ____________________________________________  RADIOLOGY  CXR: Negative  CTA chest: negative ____________________________________________   PROCEDURES  Procedure(s) performed: None Procedures Critical Care performed:  None ____________________________________________   INITIAL IMPRESSION / ASSESSMENT AND PLAN / ED COURSE  71 y.o. 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 Patient is in no distress, neuro intact, strong equal pulses x 4, lungs CTAb, abdomen is obese with mild ttp in the epigastrium. Ddx PE, PNA, AC, gastritis, PUD, pancreatitis, gb disease. Labs, CXR, UA pending. EKG with no ischemia. Will give zofran and morphine for symptom relief.     _________________________ 8:09 PM on 12/16/2016 -----------------------------------------  labs including troponin, CBC, BMP, LFTs, lipase, and urinalysis all with no acute findings. INR is therapeutic at this time. CT angiogram and no evidence of pneumonia or PE. Patient remains stable and pain free at this time. Second troponin is pending at 8:30PM. Plan to dc home with close f/u with PCP as long as 2nd troponin is negative and patient remains pain free. Care transferred to Dr. Kerman Passey   As part of my medical decision making, I reviewed the following data within the [place] notes reviewed and incorporated, Labs reviewed , EKG interpreted , Old EKG reviewed, Old chart reviewed, Patient signed out to Dr. Kerman Passey, Radiograph reviewed , Notes from prior ED visits and [place] Controlled Substance Database    Pertinent labs & imaging results that were available during my care of the patient  were reviewed by me and considered in my medical decision making (see chart for details).    ____________________________________________   FINAL CLINICAL IMPRESSION(S) / ED DIAGNOSES  Final diagnoses:  Chest pain, unspecified type  Cough  Nausea      NEW MEDICATIONS STARTED DURING THIS VISIT:  New Prescriptions   No medications on file     Note:  This document was prepared using Dragon voice recognition software and may include unintentional dictation errors.  Rudene Re, MD 12/16/16 2012

## 2016-12-16 NOTE — ED Triage Notes (Signed)
Pt reports central chest pain for four days with associated SOB, dizziness and nausea. Pt appears uncomfortable in triage. Vitals signs stable.

## 2016-12-16 NOTE — Discharge Instructions (Signed)

## 2017-07-08 DIAGNOSIS — M1611 Unilateral primary osteoarthritis, right hip: Secondary | ICD-10-CM | POA: Insufficient documentation

## 2017-11-12 DIAGNOSIS — E785 Hyperlipidemia, unspecified: Secondary | ICD-10-CM

## 2017-11-12 DIAGNOSIS — E119 Type 2 diabetes mellitus without complications: Secondary | ICD-10-CM

## 2017-11-12 DIAGNOSIS — R109 Unspecified abdominal pain: Secondary | ICD-10-CM

## 2017-11-12 DIAGNOSIS — R079 Chest pain, unspecified: Secondary | ICD-10-CM | POA: Diagnosis not present

## 2017-11-12 DIAGNOSIS — D638 Anemia in other chronic diseases classified elsewhere: Secondary | ICD-10-CM

## 2017-11-12 DIAGNOSIS — I10 Essential (primary) hypertension: Secondary | ICD-10-CM | POA: Diagnosis not present

## 2017-11-12 DIAGNOSIS — R791 Abnormal coagulation profile: Secondary | ICD-10-CM | POA: Diagnosis not present

## 2017-11-12 DIAGNOSIS — N183 Chronic kidney disease, stage 3 (moderate): Secondary | ICD-10-CM

## 2017-11-13 DIAGNOSIS — R109 Unspecified abdominal pain: Secondary | ICD-10-CM | POA: Diagnosis not present

## 2017-11-13 DIAGNOSIS — R791 Abnormal coagulation profile: Secondary | ICD-10-CM | POA: Diagnosis not present

## 2017-11-13 DIAGNOSIS — I10 Essential (primary) hypertension: Secondary | ICD-10-CM | POA: Diagnosis not present

## 2017-11-13 DIAGNOSIS — E119 Type 2 diabetes mellitus without complications: Secondary | ICD-10-CM | POA: Diagnosis not present

## 2018-02-23 DIAGNOSIS — I10 Essential (primary) hypertension: Secondary | ICD-10-CM | POA: Insufficient documentation

## 2018-02-23 DIAGNOSIS — E1122 Type 2 diabetes mellitus with diabetic chronic kidney disease: Secondary | ICD-10-CM | POA: Insufficient documentation

## 2018-02-23 DIAGNOSIS — N250 Renal osteodystrophy: Secondary | ICD-10-CM | POA: Insufficient documentation

## 2018-02-23 DIAGNOSIS — Z794 Long term (current) use of insulin: Secondary | ICD-10-CM | POA: Insufficient documentation

## 2018-06-08 IMAGING — CT CT ANGIO CHEST
2 of 7 series · 18 of 46 positions shown · IV contrast (APPLIED)
Comparison: Chest radiograph dated 12/16/2016

CLINICAL DATA: 71-year-old female with shortness of breath and
dizziness. Concern for PE.

EXAM:
CT ANGIOGRAPHY CHEST WITH CONTRAST
TECHNIQUE: Multidetector CT imaging of the chest was performed using the
standard protocol during bolus administration of intravenous
contrast. Multiplanar CT image reconstructions and MIPs were
obtained to evaluate the vascular anatomy.
CONTRAST:  60 cc Isovue 370

[Series 5: thins · axial · 0.71mm/px · z∈[+162,+414]mm · 16 of 283 slices shown]
[im 15/283  lung]
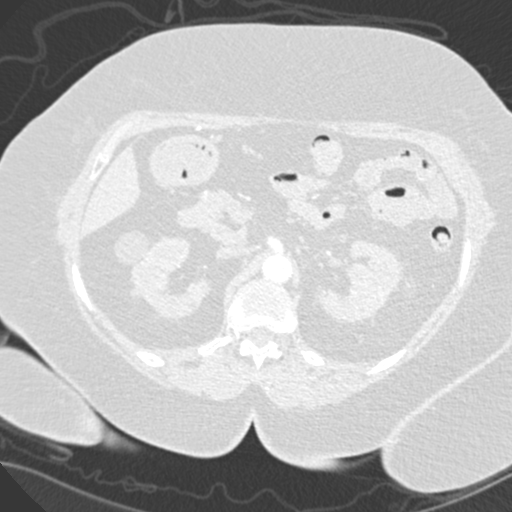
[im 30/283  soft-tissue]
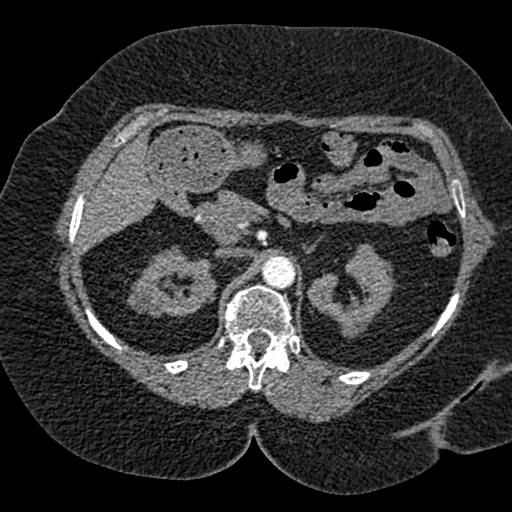
[im 45/283  lung]
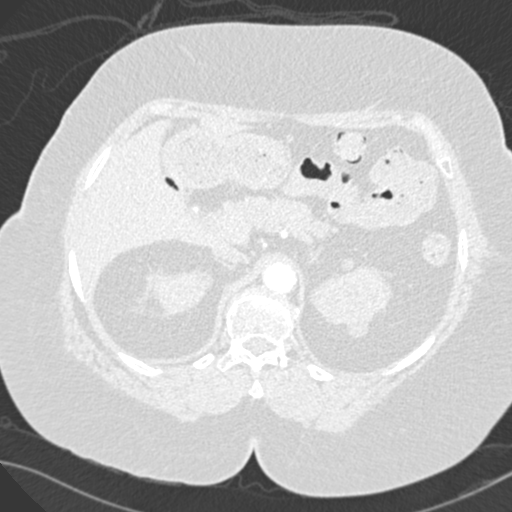
[im 60/283  soft-tissue]
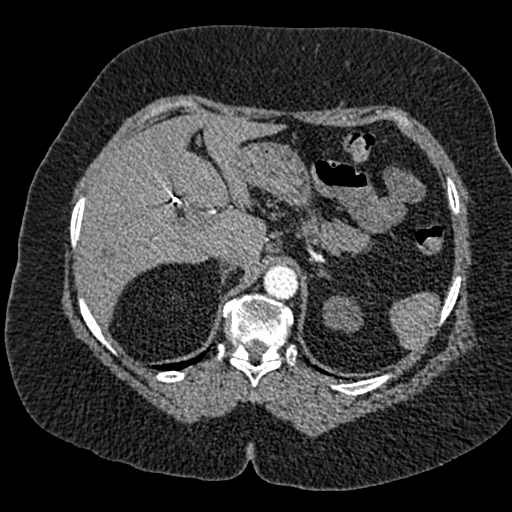
[im 90/283  lung]
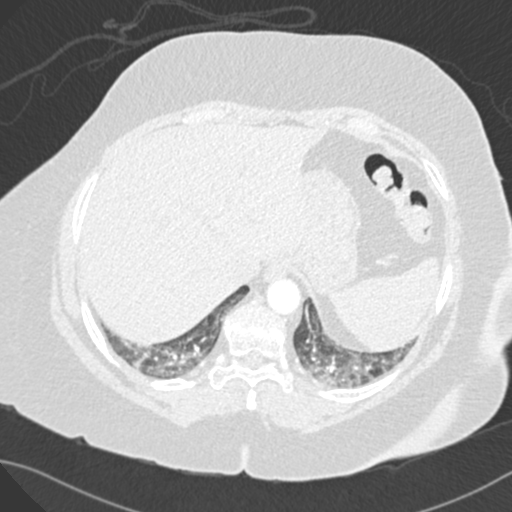
[im 104/283  soft-tissue]
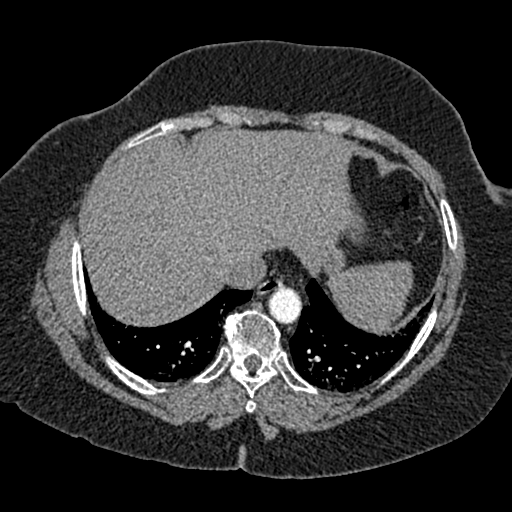
[im 119/283  lung]
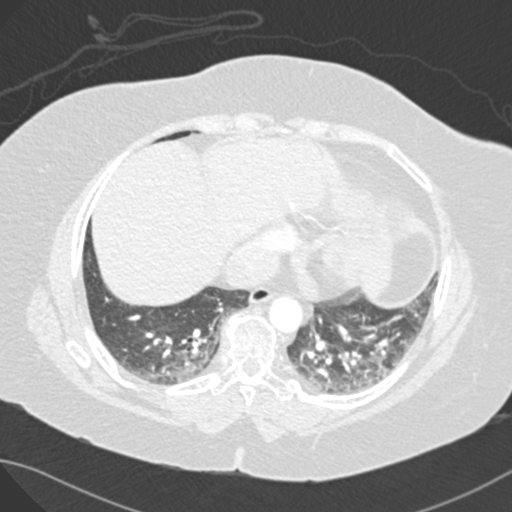
[im 134/283  soft-tissue]
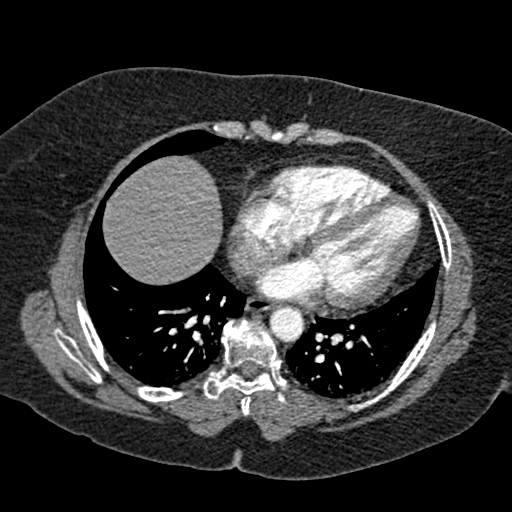
[im 149/283  lung]
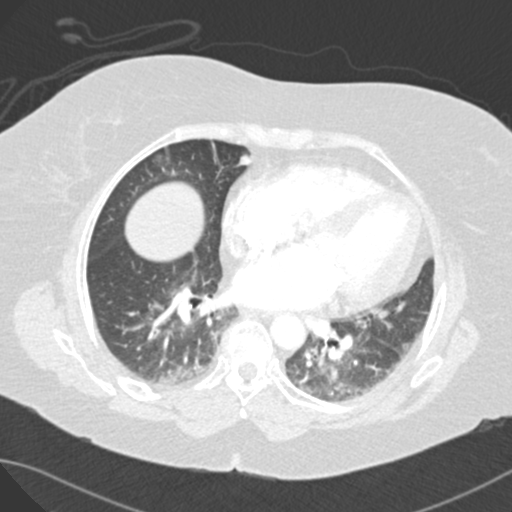
[im 164/283  soft-tissue]
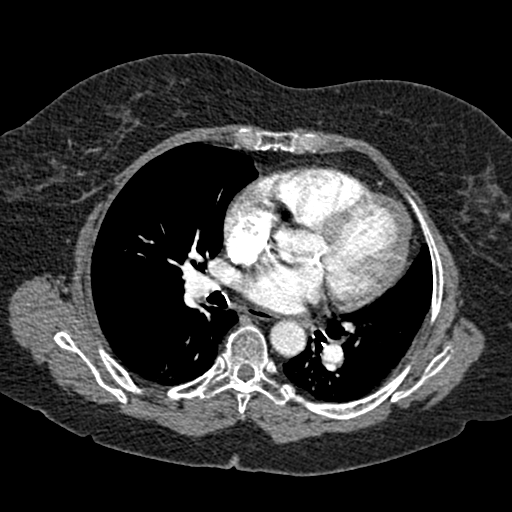
[im 179/283  lung]
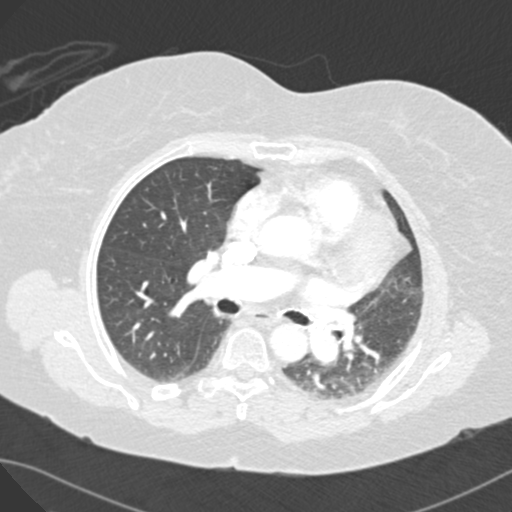
[im 193/283  soft-tissue]
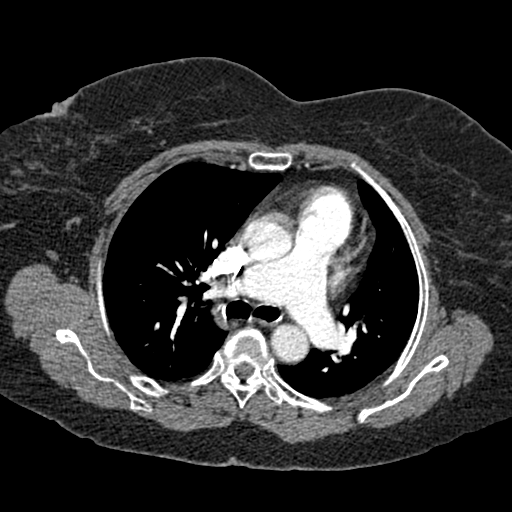
[im 223/283  lung]
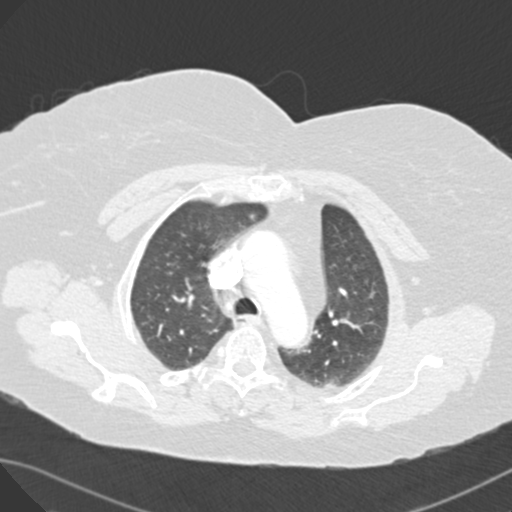
[im 238/283  soft-tissue]
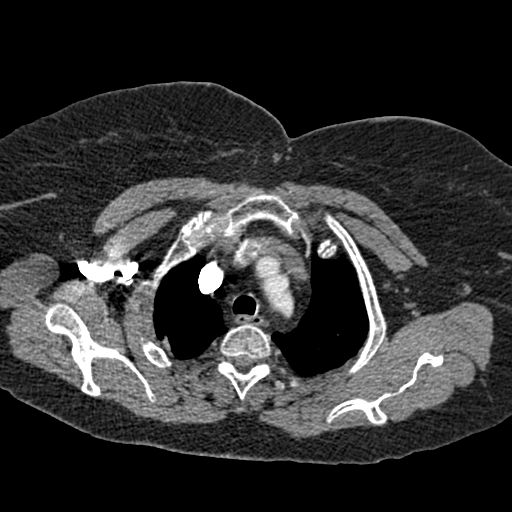
[im 253/283  lung]
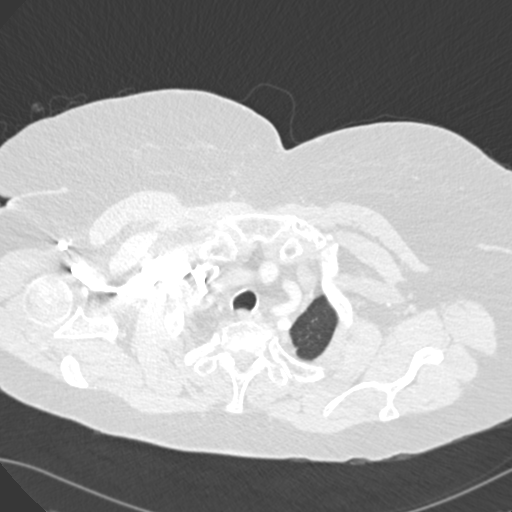
[im 268/283  soft-tissue]
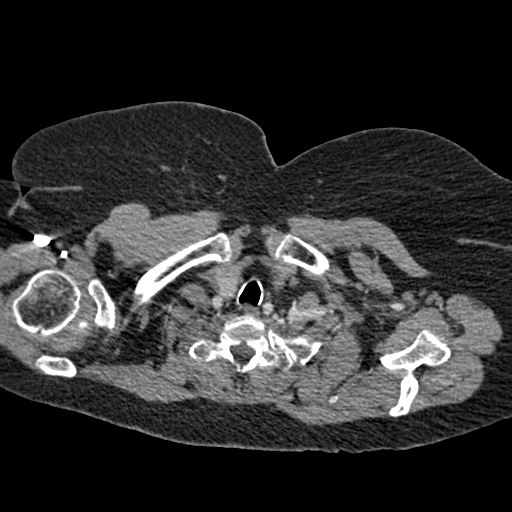

[Series 7: coronal mpr · coronal · 0.55mm/px · 2 of 86 slices shown]
[im 29/86  soft-tissue]
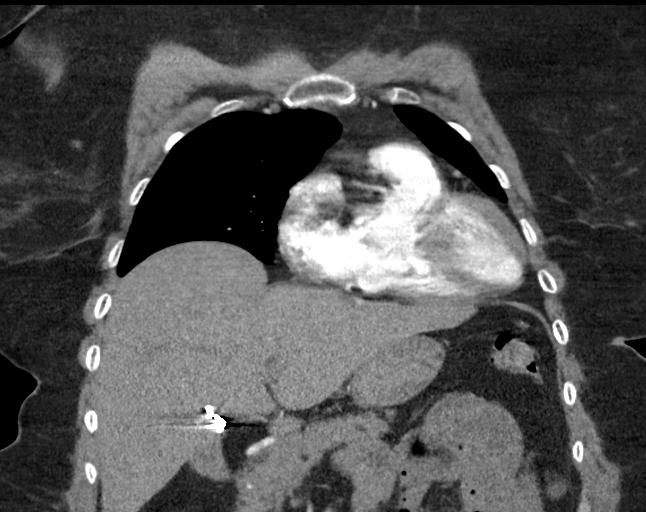
[im 57/86  soft-tissue]
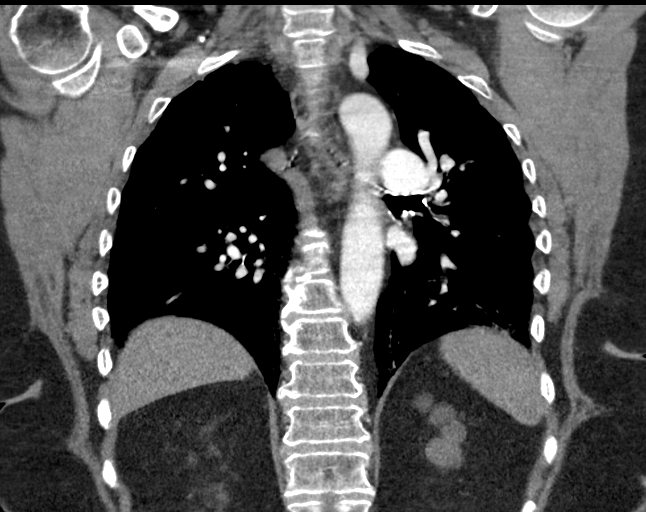

[18 of 46 positions shown; findings below may reference images not displayed]

FINDINGS: Cardiovascular: There is mild cardiomegaly. No pericardial effusion.
The thoracic aorta is unremarkable. The origins of the great vessels
of the aortic arch appear patent. There is mild prominence of the
main pulmonary trunk which may represent underlying pulmonary
hypertension. There is no CT evidence of pulmonary embolism.

Mediastinum/Nodes: No hilar or mediastinal adenopathy. Esophagus and
the thyroid gland are grossly unremarkable.

Lungs/Pleura: There are minimal bibasilar atelectatic changes. There
is no focal consolidation, pleural effusion, or pneumothorax. The
central airways are patent.

Upper Abdomen: Cholecystectomy. A 1 cm low attenuating lesion in the
right lobe of the liver is not well characterized but appears
similar to the study of 5026. Moderate bilateral renal parenchyma
atrophy. Multiple low attenuating exophytic bilateral renal lesions,
also not characterized. Ultrasound may provide better evaluation.

Musculoskeletal: No chest wall abnormality. No acute or significant
osseous findings.

Review of the MIP images confirms the above findings.
IMPRESSION: 1. No acute intrathoracic pathology. No CT evidence of pulmonary
embolism.
2. Mild cardiomegaly.

## 2018-08-15 DIAGNOSIS — G8929 Other chronic pain: Secondary | ICD-10-CM | POA: Insufficient documentation

## 2018-08-15 DIAGNOSIS — M5412 Radiculopathy, cervical region: Secondary | ICD-10-CM | POA: Insufficient documentation

## 2018-08-15 DIAGNOSIS — M25511 Pain in right shoulder: Secondary | ICD-10-CM | POA: Insufficient documentation

## 2018-09-12 DIAGNOSIS — M48061 Spinal stenosis, lumbar region without neurogenic claudication: Secondary | ICD-10-CM | POA: Insufficient documentation

## 2018-12-29 DIAGNOSIS — M109 Gout, unspecified: Secondary | ICD-10-CM | POA: Insufficient documentation

## 2018-12-29 DIAGNOSIS — E875 Hyperkalemia: Secondary | ICD-10-CM | POA: Insufficient documentation

## 2019-02-12 DIAGNOSIS — E119 Type 2 diabetes mellitus without complications: Secondary | ICD-10-CM

## 2019-02-12 DIAGNOSIS — R079 Chest pain, unspecified: Secondary | ICD-10-CM

## 2019-02-12 DIAGNOSIS — I2699 Other pulmonary embolism without acute cor pulmonale: Secondary | ICD-10-CM

## 2019-02-13 DIAGNOSIS — R079 Chest pain, unspecified: Secondary | ICD-10-CM | POA: Diagnosis not present

## 2019-02-13 DIAGNOSIS — E119 Type 2 diabetes mellitus without complications: Secondary | ICD-10-CM | POA: Diagnosis not present

## 2019-02-13 DIAGNOSIS — I2699 Other pulmonary embolism without acute cor pulmonale: Secondary | ICD-10-CM | POA: Diagnosis not present

## 2019-02-14 DIAGNOSIS — R079 Chest pain, unspecified: Secondary | ICD-10-CM | POA: Diagnosis not present

## 2019-02-14 DIAGNOSIS — I361 Nonrheumatic tricuspid (valve) insufficiency: Secondary | ICD-10-CM

## 2019-02-14 DIAGNOSIS — E119 Type 2 diabetes mellitus without complications: Secondary | ICD-10-CM | POA: Diagnosis not present

## 2019-02-14 DIAGNOSIS — I2699 Other pulmonary embolism without acute cor pulmonale: Secondary | ICD-10-CM | POA: Diagnosis not present

## 2019-02-15 DIAGNOSIS — I2699 Other pulmonary embolism without acute cor pulmonale: Secondary | ICD-10-CM | POA: Diagnosis not present

## 2019-02-15 DIAGNOSIS — R079 Chest pain, unspecified: Secondary | ICD-10-CM | POA: Diagnosis not present

## 2019-02-15 DIAGNOSIS — E119 Type 2 diabetes mellitus without complications: Secondary | ICD-10-CM | POA: Diagnosis not present

## 2019-02-16 DIAGNOSIS — R079 Chest pain, unspecified: Secondary | ICD-10-CM | POA: Diagnosis not present

## 2019-02-16 DIAGNOSIS — E119 Type 2 diabetes mellitus without complications: Secondary | ICD-10-CM | POA: Diagnosis not present

## 2019-02-16 DIAGNOSIS — I2699 Other pulmonary embolism without acute cor pulmonale: Secondary | ICD-10-CM | POA: Diagnosis not present

## 2019-02-17 ENCOUNTER — Observation Stay (HOSPITAL_COMMUNITY)
Admission: AD | Admit: 2019-02-17 | Discharge: 2019-02-18 | Disposition: A | Discharge status: Home / Self Care | Payer: Medicare Other | Source: Other Acute Inpatient Hospital | Attending: Internal Medicine | Admitting: Internal Medicine

## 2019-02-17 DIAGNOSIS — N182 Chronic kidney disease, stage 2 (mild): Secondary | ICD-10-CM | POA: Diagnosis not present

## 2019-02-17 DIAGNOSIS — Z7901 Long term (current) use of anticoagulants: Secondary | ICD-10-CM | POA: Diagnosis not present

## 2019-02-17 DIAGNOSIS — R0789 Other chest pain: Secondary | ICD-10-CM | POA: Diagnosis not present

## 2019-02-17 DIAGNOSIS — R609 Edema, unspecified: Secondary | ICD-10-CM | POA: Diagnosis not present

## 2019-02-17 DIAGNOSIS — I129 Hypertensive chronic kidney disease with stage 1 through stage 4 chronic kidney disease, or unspecified chronic kidney disease: Secondary | ICD-10-CM | POA: Diagnosis not present

## 2019-02-17 DIAGNOSIS — Z87891 Personal history of nicotine dependence: Secondary | ICD-10-CM | POA: Diagnosis not present

## 2019-02-17 DIAGNOSIS — K317 Polyp of stomach and duodenum: Secondary | ICD-10-CM | POA: Insufficient documentation

## 2019-02-17 DIAGNOSIS — D472 Monoclonal gammopathy: Secondary | ICD-10-CM | POA: Diagnosis not present

## 2019-02-17 DIAGNOSIS — I2699 Other pulmonary embolism without acute cor pulmonale: Secondary | ICD-10-CM | POA: Diagnosis not present

## 2019-02-17 DIAGNOSIS — E1122 Type 2 diabetes mellitus with diabetic chronic kidney disease: Secondary | ICD-10-CM | POA: Insufficient documentation

## 2019-02-17 DIAGNOSIS — K219 Gastro-esophageal reflux disease without esophagitis: Secondary | ICD-10-CM | POA: Diagnosis not present

## 2019-02-17 DIAGNOSIS — D638 Anemia in other chronic diseases classified elsewhere: Secondary | ICD-10-CM

## 2019-02-17 DIAGNOSIS — Z791 Long term (current) use of non-steroidal anti-inflammatories (NSAID): Secondary | ICD-10-CM | POA: Diagnosis not present

## 2019-02-17 DIAGNOSIS — R0602 Shortness of breath: Secondary | ICD-10-CM | POA: Insufficient documentation

## 2019-02-17 DIAGNOSIS — Z9049 Acquired absence of other specified parts of digestive tract: Secondary | ICD-10-CM | POA: Diagnosis not present

## 2019-02-17 DIAGNOSIS — Z86718 Personal history of other venous thrombosis and embolism: Secondary | ICD-10-CM | POA: Insufficient documentation

## 2019-02-17 DIAGNOSIS — M1990 Unspecified osteoarthritis, unspecified site: Secondary | ICD-10-CM | POA: Diagnosis not present

## 2019-02-17 DIAGNOSIS — K922 Gastrointestinal hemorrhage, unspecified: Secondary | ICD-10-CM | POA: Diagnosis present

## 2019-02-17 DIAGNOSIS — Z7951 Long term (current) use of inhaled steroids: Secondary | ICD-10-CM | POA: Diagnosis not present

## 2019-02-17 DIAGNOSIS — R12 Heartburn: Secondary | ICD-10-CM | POA: Diagnosis not present

## 2019-02-17 DIAGNOSIS — N189 Chronic kidney disease, unspecified: Secondary | ICD-10-CM

## 2019-02-17 DIAGNOSIS — Z79899 Other long term (current) drug therapy: Secondary | ICD-10-CM | POA: Diagnosis not present

## 2019-02-17 DIAGNOSIS — D631 Anemia in chronic kidney disease: Secondary | ICD-10-CM | POA: Diagnosis not present

## 2019-02-17 DIAGNOSIS — E039 Hypothyroidism, unspecified: Secondary | ICD-10-CM | POA: Diagnosis not present

## 2019-02-17 DIAGNOSIS — Z888 Allergy status to other drugs, medicaments and biological substances status: Secondary | ICD-10-CM | POA: Insufficient documentation

## 2019-02-17 DIAGNOSIS — Z86711 Personal history of pulmonary embolism: Secondary | ICD-10-CM | POA: Diagnosis not present

## 2019-02-17 DIAGNOSIS — E119 Type 2 diabetes mellitus without complications: Secondary | ICD-10-CM | POA: Diagnosis not present

## 2019-02-17 DIAGNOSIS — J45909 Unspecified asthma, uncomplicated: Secondary | ICD-10-CM | POA: Insufficient documentation

## 2019-02-17 DIAGNOSIS — R079 Chest pain, unspecified: Secondary | ICD-10-CM | POA: Diagnosis present

## 2019-02-17 DIAGNOSIS — Z20822 Contact with and (suspected) exposure to covid-19: Secondary | ICD-10-CM | POA: Diagnosis not present

## 2019-02-17 DIAGNOSIS — D509 Iron deficiency anemia, unspecified: Secondary | ICD-10-CM | POA: Insufficient documentation

## 2019-02-17 DIAGNOSIS — Z90710 Acquired absence of both cervix and uterus: Secondary | ICD-10-CM | POA: Insufficient documentation

## 2019-02-17 DIAGNOSIS — Z794 Long term (current) use of insulin: Secondary | ICD-10-CM | POA: Diagnosis not present

## 2019-02-17 DIAGNOSIS — Z885 Allergy status to narcotic agent status: Secondary | ICD-10-CM | POA: Insufficient documentation

## 2019-02-17 LAB — GLUCOSE, CAPILLARY
Glucose-Capillary: 64 mg/dL — ABNORMAL LOW (ref 70–99)
Glucose-Capillary: 73 mg/dL (ref 70–99)
Glucose-Capillary: 106 mg/dL — ABNORMAL HIGH (ref 70–99)

## 2019-02-17 LAB — TSH: TSH: 1.025 u[IU]/mL (ref 0.350–4.500)

## 2019-02-17 MED ORDER — CALCIUM CARBONATE-VITAMIN D 500-200 MG-UNIT PO TABS
ORAL_TABLET | Freq: Two times a day (BID) | ORAL | Status: DC
  Administered 2019-02-17 – 2019-02-18 (×2): 1 via ORAL
  Filled 2019-02-17 (×4): qty 1

## 2019-02-17 MED ORDER — INSULIN ASPART 100 UNIT/ML ~~LOC~~ SOLN: 0.0000 [IU] | Freq: Every day | SUBCUTANEOUS | Status: DC

## 2019-02-17 MED ORDER — VITAMIN D3 25 MCG (1000 UNIT) PO TABS
5000.0000 [IU] | ORAL_TABLET | Freq: Every day | ORAL | Status: DC
  Administered 2019-02-18: 5000 [IU] via ORAL
  Filled 2019-02-17 (×2): qty 5

## 2019-02-17 MED ORDER — RISAQUAD PO CAPS
ORAL_CAPSULE | Freq: Every day | ORAL | Status: DC
  Filled 2019-02-17: qty 1

## 2019-02-17 MED ORDER — FERROUS SULFATE 325 (65 FE) MG PO TABS: 325.0000 mg | ORAL_TABLET | ORAL | Status: DC

## 2019-02-17 MED ORDER — HYDROCHLOROTHIAZIDE 25 MG PO TABS
25.0000 mg | ORAL_TABLET | Freq: Every day | ORAL | Status: DC
  Administered 2019-02-18: 25 mg via ORAL
  Filled 2019-02-17: qty 1

## 2019-02-17 MED ORDER — AMLODIPINE BESYLATE 5 MG PO TABS
5.0000 mg | ORAL_TABLET | Freq: Every day | ORAL | Status: DC
  Filled 2019-02-17: qty 1

## 2019-02-17 MED ORDER — CHLORHEXIDINE GLUCONATE CLOTH 2 % EX PADS
6.0000 | MEDICATED_PAD | Freq: Every day | CUTANEOUS | Status: DC
  Administered 2019-02-18: 6 via TOPICAL

## 2019-02-17 MED ORDER — PANTOPRAZOLE SODIUM 40 MG PO TBEC
40.0000 mg | DELAYED_RELEASE_TABLET | Freq: Two times a day (BID) | ORAL | Status: DC
  Administered 2019-02-17 – 2019-02-18 (×2): 40 mg via ORAL
  Filled 2019-02-17 (×2): qty 1

## 2019-02-17 MED ORDER — INSULIN ASPART 100 UNIT/ML ~~LOC~~ SOLN: 0.0000 [IU] | Freq: Three times a day (TID) | SUBCUTANEOUS | Status: DC

## 2019-02-17 MED ORDER — SODIUM CHLORIDE 0.9% FLUSH: 10.0000 mL | INTRAVENOUS | Status: DC | PRN

## 2019-02-17 MED ORDER — IRBESARTAN 300 MG PO TABS
300.0000 mg | ORAL_TABLET | Freq: Every day | ORAL | Status: DC
  Administered 2019-02-18: 300 mg via ORAL
  Filled 2019-02-17: qty 1

## 2019-02-17 MED ORDER — DOCUSATE SODIUM 100 MG PO CAPS
100.0000 mg | ORAL_CAPSULE | Freq: Every day | ORAL | Status: DC
  Administered 2019-02-17: 100 mg via ORAL
  Filled 2019-02-17: qty 1

## 2019-02-17 MED ORDER — METHOCARBAMOL 500 MG PO TABS
500.0000 mg | ORAL_TABLET | Freq: Two times a day (BID) | ORAL | Status: DC | PRN
Start: 2019-02-17 — End: 2019-02-18

## 2019-02-17 MED ORDER — ROSUVASTATIN CALCIUM 20 MG PO TABS
40.0000 mg | ORAL_TABLET | Freq: Every day | ORAL | Status: DC
  Administered 2019-02-18: 40 mg via ORAL
  Filled 2019-02-17: qty 2

## 2019-02-17 MED ORDER — VALSARTAN-HYDROCHLOROTHIAZIDE 320-25 MG PO TABS: 1.0000 | ORAL_TABLET | Freq: Every day | ORAL | Status: DC

## 2019-02-17 MED ORDER — LORATADINE 10 MG PO TABS
10.0000 mg | ORAL_TABLET | Freq: Every day | ORAL | Status: DC
  Administered 2019-02-18: 10 mg via ORAL
  Filled 2019-02-17: qty 1

## 2019-02-17 MED ORDER — ACETAMINOPHEN 500 MG PO TABS: 1000.0000 mg | ORAL_TABLET | Freq: Four times a day (QID) | ORAL | Status: DC | PRN

## 2019-02-17 NOTE — H&P (Signed)
History and Physical    [name] ERX:[id number] DOB: Oct 27, 1945 DOA: 02/17/2019  PCP: System, Pcp Not In   Patient coming from: Home/transfer from [facility]  I have personally briefly reviewed patient's old medical records in [place]  Chief Complaint: Chest pain.  HPI: Katrina Brady is a 74 y.o. 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  Patient was in her bathroom to clean toilet bowl and started to feel 10 out of 10 sharp retrosternal chest pain associated with strong feeling shortness of breath, and severe generalized weakness, she went to sit up and took 1 Tylenol the chest pain subsided about 30 minutes.  Patient denied any other symptoms, no cough no palpitations, feeling nauseous or vomit.  She denied any abdominal pain or melena.  No fever chills.  ED Course: In the hospital, rest of hemoglobin showed decrease of hemoglobin from 9.7-8.9, patient was made to give patient PRBC x1. FOBT positive.  Patient hemoglobin stabilized after transfusion, last 2 readings were 8.4 and 8.6.  Patient no longer has any chest pain, and cardiology in [place] evaluated the patient and recommend GI work-up before cardiac work-up.  Covid screening screening negative.  Review of Systems: As per HPI otherwise 10 point review of systems negative.    Past Medical History:  Diagnosis Date  . Arthritis   . Asthma   . Blood transfusion without reported diagnosis   . Hypertension     Past Surgical History:  Procedure Laterality Date  . ABDOMINAL HYSTERECTOMY    . APPENDECTOMY    . CHOLECYSTECTOMY    . HAND SURGERY    . KNEE SURGERY Bilateral      reports that she has quit smoking. She has never used smokeless tobacco. She reports that she does not drink alcohol or use drugs.  Allergies  Allergen  Reactions  . Actonel [Risedronate Sodium] Other (See Comments)    Chest pain   . Codeine Nausea Only  . Meloxicam Other (See Comments)    Chest tightness   . Talwin [Pentazocine] Other (See Comments)    hallucination    Family history, multiple members on father's side had PE/VTE  Prior to Admission medications   Medication Sig Start Date End Date Taking? Authorizing Provider  acetaminophen (TYLENOL) 500 MG tablet Take 1,000 mg by mouth every 6 (six) hours as needed for headache (pain).   Yes [provider]  amLODipine (NORVASC) 5 MG tablet Take 5 mg by mouth daily.   Yes [provider]  Bioflavonoid Products (VITAMIN C) CHEW Chew 2 tablets by mouth daily at 3 pm.   Yes [provider]  Calcium Carb-Cholecalciferol (CALCIUM 600-D PO) Take 600 mg by mouth 2 (two) times daily.   Yes [provider]  cetirizine (ZYRTEC) 10 MG tablet Take 10 mg by mouth at bedtime.    Yes [provider]  Cholecalciferol (VITAMIN D-3) 5000 units TABS Take 5,000 Units by mouth daily.   Yes [provider]  docusate sodium (COLACE) 100 MG capsule Take 100 mg by mouth at bedtime.    Yes [provider]  ferrous sulfate 325 (65 FE) MG tablet Take 325 mg by mouth See admin instructions. Take 1 tablet (325 mg) by mouth Monday, Wednesday, Friday - mid afternoon (3pm)   Yes [provider]  HYDROcodone-acetaminophen (NORCO/VICODIN) 5-325  MG tablet Take 1 tablet by mouth every 6 (six) hours as needed for moderate pain. 06/27/16  Yes Milton Ferguson, MD  methocarbamol (ROBAXIN) 500 MG tablet Take 1 tablet (500 mg total) by mouth 2 (two) times daily as needed for muscle spasms. 05/20/14  Yes Noemi Chapel, MD  Multiple Vitamin (MULTIVITAMIN WITH MINERALS) TABS tablet Take 1 tablet by mouth daily.   Yes [provider]  pantoprazole (PROTONIX) 40 MG tablet Take 40 mg by mouth daily before breakfast.    Yes [provider]  potassium  chloride SA (K-DUR,KLOR-CON) 20 MEQ tablet Take 20 mEq by mouth at bedtime.   Yes [provider]  Probiotic Product (RA PROBIOTIC GUMMIES PO) Take 2 tablets by mouth daily at 12 noon.   Yes [provider]  rosuvastatin (CRESTOR) 40 MG tablet Take 40 mg by mouth daily. 06/19/16  Yes [provider]  traMADol (ULTRAM) 50 MG tablet Take 50 mg by mouth 3 (three) times daily as needed (pain).  06/22/16  Yes [provider]  valsartan-hydrochlorothiazide (DIOVAN-HCT) 320-25 MG tablet Take 1 tablet by mouth daily. 06/17/16  Yes [provider]  warfarin (COUMADIN) 5 MG tablet Take 5-7.5 mg by mouth See admin instructions. Take 1 1/2 tablets (7.5 mg) by mouth Tuesday and Thursday after supper, take 1 tablet (5 mg) on Sunday, Monday, Wednesday, Friday, Saturday after supper   Yes [provider]    Physical Exam: Vitals:   02/17/19 1330  BP: (!) 148/63  Pulse: 78  Resp: 20  Temp: (!) 97.5 F (36.4 C)  TempSrc: Oral  SpO2: 99%  Weight: 134.7 kg  Height: 5\' 3"  (1.6 m)    Constitutional: NAD, calm, comfortable Vitals:   02/17/19 1330  BP: (!) 148/63  Pulse: 78  Resp: 20  Temp: (!) 97.5 F (36.4 C)  TempSrc: Oral  SpO2: 99%  Weight: 134.7 kg  Height: 5\' 3"  (1.6 m)   Eyes: PERRL, lids and conjunctivae normal ENMT: Mucous membranes are moist. Posterior pharynx clear of any exudate or lesions.Normal dentition.  Neck: normal, supple, no masses, no thyromegaly Respiratory: clear to auscultation bilaterally, no wheezing, no crackles. Normal respiratory effort. No accessory muscle use.  Cardiovascular: Regular rate and rhythm, no murmurs / rubs / gallops. No extremity edema. 2+ pedal pulses. No carotid bruits.  Abdomen: no tenderness, no masses palpated. No hepatosplenomegaly. Bowel sounds positive.  Musculoskeletal: no clubbing / cyanosis. No joint deformity upper and lower extremities. Good ROM, no contractures. Normal muscle tone.  Skin: no  rashes, lesions, ulcers. No induration Neurologic: CN 2-12 grossly intact. Sensation intact, DTR normal. Strength 5/5 in all 4.  Psychiatric: Normal judgment and insight. Alert and oriented x 3. Normal mood.     Labs on Admission: I have personally reviewed following labs and imaging studies  CBC: No results for input(s): WBC, NEUTROABS, HGB, HCT, MCV, PLT in the last 168 hours. Basic Metabolic Panel: No results for input(s): NA, K, CL, CO2, GLUCOSE, BUN, CREATININE, CALCIUM, MG, PHOS in the last 168 hours. GFR: CrCl cannot be calculated (Patient's most recent lab result is older than the maximum 21 days allowed.). Liver Function Tests: No results for input(s): AST, ALT, ALKPHOS, BILITOT, PROT, ALBUMIN in the last 168 hours. No results for input(s): LIPASE, AMYLASE in the last 168 hours. No results for input(s): AMMONIA in the last 168 hours. Coagulation Profile: No results for input(s): INR, PROTIME in the last 168 hours. Cardiac Enzymes: No results for input(s): CKTOTAL, CKMB, CKMBINDEX,  TROPONINI in the last 168 hours. BNP (last 3 results) No results for input(s): PROBNP in the last 8760 hours. HbA1C: No results for input(s): HGBA1C in the last 72 hours. CBG: Recent Labs  Lab 02/17/19 1659 02/17/19 1720  GLUCAP 64* 73   Lipid Profile: No results for input(s): CHOL, HDL, LDLCALC, TRIG, CHOLHDL, LDLDIRECT in the last 72 hours. Thyroid Function Tests: Recent Labs    02/17/19 1613  TSH 1.025   Anemia Panel: No results for input(s): VITAMINB12, FOLATE, FERRITIN, TIBC, IRON, RETICCTPCT in the last 72 hours. Urine analysis: No results found for: COLORURINE, APPEARANCEUR, LABSPEC, PHURINE, GLUCOSEU, HGBUR, BILIRUBINUR, KETONESUR, PROTEINUR, UROBILINOGEN, NITRITE, LEUKOCYTESUR  Radiological Exams on Admission: No results found.  EKG: Independently reviewed. No ST-T changes.  Assessment/Plan Active Problems:   Anemia due to chronic kidney disease   GI bleeding  Acute  on chronic iron deficiency anemia, likely GI source, discussed with on-call GI attending, as per GI attending, the GI work-up probably can be done as outpatient given patient hemoglobin has been stabilized after transfusion and pt has a clear Hx of chronic iron deficiency anemia.  Continue iron supplement. Liquid diet.  Atypical chest pain, once GI clears the patient can consider reconsult cardiology for stress test vs other cardiac work-up.  Echocardiogram in [facility] reviewed which showed a normal LVEF 60 to 65% and no significant wall motion issue.  PE, H/H remains table and no inpt GI workup plan, consider restart for high risk PE  HTN, continue home meds and add PRN BP meds.  IDDM, start sliding scale.  Hypothyroidism, on synthyroid.      DVT prophylaxis: INR therapeutic Code Status: Full Code Family Communication: None at bedside  Disposition Plan: Home Consults called: GI Admission status: tele   Lequita Halt MD [facility] Pager [phone]  If 7PM-7AM, please contact night-coverage [email or web address] Password [email or web address]  02/17/2019, 7:30 PM

## 2019-02-17 NOTE — Progress Notes (Signed)
Hypoglycemic Event  CBG: 64  Treatment: 4 oz juice/soda  Symptoms: None  Follow-up CBG: Time:1721 CBG Result:73  Possible Reasons for Event: Unknown  Comments/MD notified:Zhang    Katrina Brady

## 2019-02-18 DIAGNOSIS — Z7901 Long term (current) use of anticoagulants: Secondary | ICD-10-CM | POA: Diagnosis not present

## 2019-02-18 DIAGNOSIS — D631 Anemia in chronic kidney disease: Secondary | ICD-10-CM

## 2019-02-18 DIAGNOSIS — N189 Chronic kidney disease, unspecified: Secondary | ICD-10-CM | POA: Diagnosis not present

## 2019-02-18 DIAGNOSIS — K922 Gastrointestinal hemorrhage, unspecified: Secondary | ICD-10-CM | POA: Diagnosis not present

## 2019-02-18 DIAGNOSIS — D638 Anemia in other chronic diseases classified elsewhere: Secondary | ICD-10-CM

## 2019-02-18 DIAGNOSIS — N184 Chronic kidney disease, stage 4 (severe): Secondary | ICD-10-CM | POA: Diagnosis not present

## 2019-02-18 DIAGNOSIS — R079 Chest pain, unspecified: Secondary | ICD-10-CM | POA: Diagnosis present

## 2019-02-18 DIAGNOSIS — R195 Other fecal abnormalities: Secondary | ICD-10-CM

## 2019-02-18 DIAGNOSIS — I82403 Acute embolism and thrombosis of unspecified deep veins of lower extremity, bilateral: Secondary | ICD-10-CM

## 2019-02-18 LAB — CBC
HCT: 29.8 % — ABNORMAL LOW (ref 36.0–46.0)
Hemoglobin: 8.9 g/dL — ABNORMAL LOW (ref 12.0–15.0)
MCH: 23.8 pg — ABNORMAL LOW (ref 26.0–34.0)
MCHC: 29.9 g/dL — ABNORMAL LOW (ref 30.0–36.0)
MCV: 79.7 fL — ABNORMAL LOW (ref 80.0–100.0)
Platelets: 188 10*3/uL (ref 150–400)
RBC: 3.74 MIL/uL — ABNORMAL LOW (ref 3.87–5.11)
RDW: 16.7 % — ABNORMAL HIGH (ref 11.5–15.5)
WBC: 4.6 10*3/uL (ref 4.0–10.5)
nRBC: 0 % (ref 0.0–0.2)

## 2019-02-18 LAB — GLUCOSE, CAPILLARY
Glucose-Capillary: 76 mg/dL (ref 70–99)
Glucose-Capillary: 67 mg/dL — ABNORMAL LOW (ref 70–99)
Glucose-Capillary: 82 mg/dL (ref 70–99)

## 2019-02-18 LAB — BASIC METABOLIC PANEL
Anion gap: 10 (ref 5–15)
BUN: 21 mg/dL (ref 8–23)
CO2: 22 mmol/L (ref 22–32)
Calcium: 9.2 mg/dL (ref 8.9–10.3)
Chloride: 108 mmol/L (ref 98–111)
Creatinine, Ser: 1.66 mg/dL — ABNORMAL HIGH (ref 0.44–1.00)
GFR calc Af Amer: 35 mL/min — ABNORMAL LOW (ref >60)
GFR calc non Af Amer: 30 mL/min — ABNORMAL LOW (ref >60)
Glucose, Bld: 86 mg/dL (ref 70–99)
Potassium: 3.9 mmol/L (ref 3.5–5.1)
Sodium: 140 mmol/L (ref 135–145)

## 2019-02-18 LAB — PROTIME-INR
INR: 2.4 — ABNORMAL HIGH (ref 0.8–1.2)
Prothrombin Time: 25.9 seconds — ABNORMAL HIGH (ref 11.4–15.2)

## 2019-02-18 NOTE — Care Management CC44 (Signed)
Condition Code 44 Documentation Completed  Patient Details  Name: Solaris Kram MRN: [id number] Date of Birth: 04-02-1945   Condition Code 44 given:  Yes Patient signature on Condition Code 44 notice:  Yes Documentation of 2 MD's agreement:  Yes Code 44 added to claim:  Yes    Claudie Leach, RN 02/18/2019, 9:53 AM

## 2019-02-18 NOTE — Care Management Obs Status (Signed)
[place] NOTIFICATION   Patient Details  Name: Katrina Brady MRN: [id number] Date of Birth: [birth year]   Medicare Observation Status Notification Given:  Yes    Claudie Leach, RN 02/18/2019, 9:52 AM

## 2019-02-18 NOTE — Discharge Summary (Signed)
Physician Discharge Summary  Katrina Brady IHK:[id number] DOB: 10/30/1945 DOA: 02/17/2019  PCP: System, Pcp Not In  Admit date: 02/17/2019 Discharge date: 02/18/2019  Admitted From: Observation Disposition: home  Recommendations for Outpatient Follow-up:  1. Follow up with PCP in 1-2 weeks 2. Please obtain BMP/CBC in one week 3. Please follow up on the following pending results:  Home Health:No Equipment/Devices:none  Discharge Condition:Stable CODE STATUS:Full code Diet recommendation: resume preadmission diet  Brief/Interim Summary: Brief/Interim Summary:  Katrina Brady is a 74 y.o. 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  Patient was in her bathroom to clean toilet bowl and started to feel 10 out of 10 sharp retrosternal chest pain associated with strong feeling shortness of breath, and severe generalized weakness, she went to sit up and took 1 Tylenol the chest pain subsided about 30 minutes.  Patient denied any other symptoms, no cough no palpitations, feeling nauseous or vomit.  She denied any abdominal pain or melena.  No fever chills.  ED Course: In the hospital, rest of hemoglobin showed decrease of hemoglobin from 9.7-8.9, patient was made to give patient PRBC x1. FOBT positive.  Patient hemoglobin stabilized after transfusion, last 2 readings were 8.4 and 8.6.  Patient no longer has any chest pain, and cardiology in [place] evaluated the patient and recommend GI work-up before cardiac work-up.  Covid screening screening negative.  Hospital Course: Acute on chronic iron deficiency anemia, likely GI source,  GI attending saw in consult, pt stable no indication for acute inpatient workup, d/c home with outpt f/up  Atypical chest pain, trop neg, awaiitng clearnace as outpt for further eval, no  anginal sx in the hospital, pt requested outpt f/up and d/c homre.  Echocardiogram in [facility] reviewed which showed a normal LVEF 60 to 65% and no significant wall motion issue.  PE, H/H remains table and no inpt GI workup plan, resume home meds  HTN, continue home meds and add PRN BP meds.  IDDM, start sliding scale.  Hypothyroidism, on synthyroid.   Discharge Diagnoses:  Active Problems:   Anemia due to chronic kidney disease   GI bleeding   Chest pain    Discharge Instructions  Discharge Instructions    Call MD for:  difficulty breathing, headache or visual disturbances   Complete by: As directed    Call MD for:  extreme fatigue   Complete by: As directed    Call MD for:  hives   Complete by: As directed    Call MD for:  persistant dizziness or light-headedness   Complete by: As directed    Call MD for:  persistant nausea and vomiting   Complete by: As directed    Call MD for:  severe uncontrolled pain   Complete by: As directed    Call MD for:  temperature >100.4   Complete by: As directed    Diet - low sodium heart healthy   Complete by: As directed    Increase activity slowly   Complete by: As directed      Allergies as of 02/18/2019      Reactions   Actonel [risedronate Sodium] Other (See Comments)   Chest pain   Codeine Nausea Only   Meloxicam Other (See Comments)   Chest tightness    Talwin [pentazocine] Other (See Comments)   hallucination   Amlodipine  Swelling   edema      Medication List    TAKE these medications   acetaminophen 500 MG tablet Commonly known as: TYLENOL Take 1,000 mg by mouth every 6 (six) hours as needed for headache (pain).   albuterol 108 (90 Base) MCG/ACT inhaler Commonly known as: VENTOLIN HFA Inhale 2 puffs into the lungs every 4 (four) hours as needed for wheezing or shortness of breath.   allopurinol 100 MG tablet Commonly known as: ZYLOPRIM Take 200 mg by mouth daily with breakfast. Notes to patient:  Resume to your regular schedule   Artificial Tears 1.4 % ophthalmic solution Generic drug: polyvinyl alcohol Place 1 drop into both eyes daily. Notes to patient: Resume to your regular schedule   Candesartan Cilexetil-HCTZ 32-25 MG Tabs Take 1 tablet by mouth daily with breakfast. Notes to patient: Tomorrow at 10:00 AM   carvedilol 3.125 MG tablet Commonly known as: COREG Take 3.125-6.25 mg by mouth See admin instructions. Take 2 tablets (6.25 mg) by mouth daily with breakfast and 1 tablet (3.125 mg) with supper Notes to patient: Resume to your regular schedule   cetirizine 10 MG tablet Commonly known as: ZYRTEC Take 10 mg by mouth at bedtime. Notes to patient: Tomorrow at bedtime   docusate sodium 100 MG capsule Commonly known as: COLACE Take 100 mg by mouth at bedtime. Notes to patient: This evening at 10:00 PM   ezetimibe 10 MG tablet Commonly known as: ZETIA Take 10 mg by mouth daily with breakfast. Notes to patient: Resume to your regular schedule   ferrous sulfate 325 (65 FE) MG tablet Take 325 mg by mouth See admin instructions. Take 1 tablet (325 mg) by mouth Monday, Wednesday, Friday - mid afternoon (3pm) Notes to patient: Resume to your regular schedule   Flovent HFA 110 MCG/ACT inhaler Generic drug: fluticasone Inhale 2 puffs into the lungs 2 (two) times daily. Notes to patient: Resume to your regular schedule   fluticasone 50 MCG/ACT nasal spray Commonly known as: FLONASE Place 2 sprays into both nostrils daily as needed for allergies. Notes to patient: Resume to your regular schedule   HYDROcodone-acetaminophen 5-325 MG tablet Commonly known as: NORCO/VICODIN Take 1 tablet by mouth every 6 (six) hours as needed for moderate pain.   Magnesium 200 MG Chew Chew 400 mg by mouth daily with supper.   methocarbamol 500 MG tablet Commonly known as: ROBAXIN Take 1 tablet (500 mg total) by mouth 2 (two) times daily as needed for muscle spasms.   multivitamin  with minerals Tabs tablet Take 1 tablet by mouth daily before breakfast. Notes to patient: Resume to your regular schedule   pantoprazole 40 MG tablet Commonly known as: PROTONIX Take 40 mg by mouth daily before breakfast. Notes to patient: Tomorrow at 8:00 AM   rosuvastatin 40 MG tablet Commonly known as: CRESTOR Take 40 mg by mouth at bedtime. Notes to patient: Tomorrow at 10:00 PM   sucralfate 1 g tablet Commonly known as: CARAFATE Take 1 g by mouth 4 (four) times daily. Notes to patient: Resume to your regular schedule   Tresiba FlexTouch 200 UNIT/ML Sopn Generic drug: Insulin Degludec Inject 35 Units into the skin at bedtime as needed (CBG >150). Notes to patient: Resume to your regular schedule   Vitamin C Chew Chew 2 tablets by mouth daily at 3 pm. Notes to patient: Resume to your regular schedule   Vitamin D-3 125 MCG (5000 UT) Tabs Take 5,000 Units by mouth daily. Notes to patient: Tomorrow  at 10:00 AM   warfarin 5 MG tablet Commonly known as: COUMADIN Take 2.5-5 mg by mouth See admin instructions. Take 1/2 tablet (2.5 mg) by mouth on Thursday evening, take 1 tablet (5 mg) on all other nights of the week Notes to patient: Resume to your regular schedule       Allergies  Allergen Reactions  . Actonel [Risedronate Sodium] Other (See Comments)    Chest pain   . Codeine Nausea Only  . Meloxicam Other (See Comments)    Chest tightness   . Talwin [Pentazocine] Other (See Comments)    hallucination  . Amlodipine Swelling    edema     Consultations:  gi   Procedures/Studies:  No results found.    Subjective: 'I feel fine', requested d/c home  Discharge Exam: Vitals:   02/17/19 2121 02/18/19 0430  BP: (!) 144/76 134/63  Pulse: 76 77  Resp: 16 16  Temp: (!) 97 F (36.1 C) 98.2 F (36.8 C)  SpO2: 98% 98%   Vitals:   02/17/19 1330 02/17/19 2121 02/18/19 0430  BP: (!) 148/63 (!) 144/76 134/63  Pulse: 78 76 77  Resp: 20 16 16   Temp: (!)  97.5 F (36.4 C) (!) 97 F (36.1 C) 98.2 F (36.8 C)  TempSrc: Oral Axillary Oral  SpO2: 99% 98% 98%  Weight: 134.7 kg    Height: 5\' 3"  (1.6 m)      General: Pt is alert, awake, not in acute distress Cardiovascular: RRR, S1/S2 +, no rubs, no gallops Respiratory: CTA bilaterally, no wheezing, no rhonchi Abdominal: Soft, NT, ND, bowel sounds + Extremities: no edema, no cyanosis    The results of significant diagnostics from this hospitalization (including imaging, microbiology, ancillary and laboratory) are listed below for reference.     Microbiology: No results found for this or any previous visit (from the past 240 hour(s)).   Labs: BNP (last 3 results) No results for input(s): BNP in the last 8760 hours. Basic Metabolic Panel: Recent Labs  Lab 02/18/19 0419  NA 140  K 3.9  CL 108  CO2 22  GLUCOSE 86  BUN 21  CREATININE 1.66*  CALCIUM 9.2   Liver Function Tests: No results for input(s): AST, ALT, ALKPHOS, BILITOT, PROT, ALBUMIN in the last 168 hours. No results for input(s): LIPASE, AMYLASE in the last 168 hours. No results for input(s): AMMONIA in the last 168 hours. CBC: Recent Labs  Lab 02/18/19 0419  WBC 4.6  HGB 8.9*  HCT 29.8*  MCV 79.7*  PLT 188   Cardiac Enzymes: No results for input(s): CKTOTAL, CKMB, CKMBINDEX, TROPONINI in the last 168 hours. BNP: Invalid input(s): POCBNP CBG: Recent Labs  Lab 02/17/19 1720 02/17/19 2117 02/18/19 0748 02/18/19 1136 02/18/19 1212  GLUCAP 73 106* 76 67* 82   D-Dimer No results for input(s): DDIMER in the last 72 hours. Hgb A1c No results for input(s): HGBA1C in the last 72 hours. Lipid Profile No results for input(s): CHOL, HDL, LDLCALC, TRIG, CHOLHDL, LDLDIRECT in the last 72 hours. Thyroid function studies Recent Labs    02/17/19 1613  TSH 1.025   Anemia work up No results for input(s): VITAMINB12, FOLATE, FERRITIN, TIBC, IRON, RETICCTPCT in the last 72 hours. Urinalysis No results found  for: COLORURINE, APPEARANCEUR, LABSPEC, [place], GLUCOSEU, HGBUR, BILIRUBINUR, KETONESUR, PROTEINUR, UROBILINOGEN, NITRITE, LEUKOCYTESUR Sepsis Labs Invalid input(s): PROCALCITONIN,  WBC,  LACTICIDVEN Microbiology No results found for this or any previous visit (from the past 240 hour(s)).   Time coordinating discharge:  Over 30 minutes  SIGNED:   Nicolette Bang, MD  [facility] 02/18/2019, 12:20 PM Pager   If 7PM-7AM, please contact night-coverage [email or web address] Password [email or web address]

## 2019-02-18 NOTE — Consult Note (Addendum)
Referring Provider: Dr. Wynetta Fines  Primary Care Physician:  System, Pcp Not In Primary Gastroenterologist:  Dr. Sim Boast  Reason for Consultation:  Anemia   HPI: Katrina Brady is a 74 y.o. female with a past medical history significant for arthritis, asthma, hypertension, DM II, CKD stage II, MGUS, IDA, LLE DVT 1997, RLE DVT 1998, PE 1997 on Coumadin, GERD and colon polyps. Past cholecystectomy, appendectomy, hysterectomy land laparoscopic lysis of intestinal adhesions 2012.  She developed chest pain with associated SOB and severe generalized weakness. She presented to [facility] 1/2. Hg was 8.9 down from 9.7. FOBT positive. She received 1 unit of PRBCs. Her chest pain resolved. She was seen by  Cardiology at [facility] who recommended GI work up prior to proceeding with any further cardiac evaluation. She therefore transferred to [facility]. She is followed by gastroenterologist, Dr. Sim Boast at [place]. She was last seen by Dr. Rojelio Brenner on 02/24/2018 following a hospital admission for chest pain and heartburn. She continued to have symptoms on PPI bid and Carafate. She underwent an EGD 02/24/2018 which showed minimal antral erythema, the esophagus and duodenum were normal. Her most recent colonoscopy was 04/2016, 1 or 2 benign colon polyps were removed. See GI procedure history below. History of MGUS followed by hematologist Dr. Grayland Jack at [facility].  Last Iron IV infusion was 02/2018.   ED course: Na 140. K 3.9. Glu 86. BUN 21. Cr. 1.66. Anion gap 10. WBC 4.6. Hg 8.9. HCT 29.8. MCV 79.7. RDW 16.7. PLT 188. INR 2.4. TSH 1.025. She received 1 unit of PRBCs.    Currently, she feels well. She stated she did not know why I was assessing her at this time as she is going home. I contacted hospitalist, Dr. Wyonia Hough and he verified no further GI evaluation required at this time. Patient to follow up with  Dr. Sim Boast as an out patient. Patient to proceed with cardiac  evaluation prior to proceeding with any further GI work up.   GI Procedure History:   Colonoscopy 05/03/2016  By Dr. Gala Romney: A polyp was removed from the ascending and descending colon. ?  Showed a tubular adenoma   Nuclear Medicine Gastric Emptying 12/04/2018 FINDINGS: Orally ingested radionuclide empties normally from the stomach. T1/2 of gastric emptying is 79 minutes which is within normal limits. Impression: Normal gastric emptying exam.  EGD 02/24/2018 by Dr. Curtis Sites [facility]:  Minimal antral erythema. Biopsies were taken from the antrum with cold biopsy forceps. The esophagus was normal in appearance. The duodenum was normal in appearance. There is no evidence of Barrett's, esophagitis or esophageal strictures. Biopsies: No evidence of intestinal metaplasia or H. Pylori.   EGD by Dr. Tharon Aquas 12/01/2016:  Impression:Normal esophagus. Gastritis. Biopsied. A few fundic gland polyps. Normal examined duodenum. Pathology showed A. Stomach, antrum and body: The specimen includes both body and antral-type gastric mucosa. In these fragments the superficial, foveolar epithelium displays reactive alterations including mucus depletion and nuclear enlargement. The lamina propria is edematous and congested. It contains a few inflammatory cells, but the reactive changes are out of proportion with the relatively sparse inflammation. No intra-vascular thrombi are detected within capillaries. No neoplasm or intestinal metaplasia is identified. This pattern is most suggestive of a so-called reactive gastropathy. The changes are frequently seen in patients receiving non-steroidal anti-inflammatory drugs, ingesting alcohol or experiencing bile reflux. They can also be seen with a variety of other types of mucosal injury. Final pathologic diagnosis.  A. Stomach, antrum and body, biopsy: Reactive gastropathy, moderate No neoplasm No H. pylori infection.   EGD 01/23/2014:   which showed the  Z-line was regular at 40 cm from the incisors. The entire stomach was normal, the entire duodenum was normal, exam was otherwise without abnormality. Biopsies were taken and pathology showed peptic duodenitis and chronic gastritis without any associated H. pylori     Past Medical History:  Diagnosis Date  . Arthritis   . Asthma   . Blood transfusion without reported diagnosis   . Hypertension     Past Surgical History:  Procedure Laterality Date  . ABDOMINAL HYSTERECTOMY    . APPENDECTOMY    . CHOLECYSTECTOMY    . HAND SURGERY    . KNEE SURGERY Bilateral     Prior to Admission medications   Medication Sig Start Date End Date Taking? Authorizing Provider  acetaminophen (TYLENOL) 500 MG tablet Take 1,000 mg by mouth every 6 (six) hours as needed for headache (pain).   Yes [provider]  albuterol (VENTOLIN HFA) 108 (90 Base) MCG/ACT inhaler Inhale 2 puffs into the lungs every 4 (four) hours as needed for wheezing or shortness of breath.    Yes [provider]  allopurinol (ZYLOPRIM) 100 MG tablet Take 200 mg by mouth daily with breakfast.  01/23/19  Yes [provider]  Bioflavonoid Products (VITAMIN C) CHEW Chew 2 tablets by mouth daily at 3 pm.   Yes [provider]  Candesartan Cilexetil-HCTZ 32-25 MG TABS Take 1 tablet by mouth daily with breakfast. 01/23/19  Yes [provider]  carvedilol (COREG) 3.125 MG tablet Take 3.125-6.25 mg by mouth See admin instructions. Take 2 tablets (6.25 mg) by mouth daily with breakfast and 1 tablet (3.125 mg) with supper 01/28/19  Yes [provider]  cetirizine (ZYRTEC) 10 MG tablet Take 10 mg by mouth at bedtime.    Yes [provider]  Cholecalciferol (VITAMIN D-3) 5000 units TABS Take 5,000 Units by mouth daily.   Yes [provider]  docusate sodium (COLACE) 100 MG capsule Take 100 mg by mouth at bedtime.    Yes [provider]  ezetimibe (ZETIA) 10 MG tablet Take  10 mg by mouth daily with breakfast. 01/01/19  Yes [provider]  ferrous sulfate 325 (65 FE) MG tablet Take 325 mg by mouth See admin instructions. Take 1 tablet (325 mg) by mouth Monday, Wednesday, Friday - mid afternoon (3pm)   Yes [provider]  fluticasone (FLONASE) 50 MCG/ACT nasal spray Place 2 sprays into both nostrils daily as needed for allergies.    Yes [provider]  fluticasone (FLOVENT HFA) 110 MCG/ACT inhaler Inhale 2 puffs into the lungs 2 (two) times daily. 06/09/18  Yes [provider]  Insulin Degludec (TRESIBA FLEXTOUCH) 200 UNIT/ML SOPN Inject 35 Units into the skin at bedtime as needed (CBG >150).   Yes [provider]  Magnesium 200 MG CHEW Chew 400 mg by mouth daily with supper.   Yes [provider]  Multiple Vitamin (MULTIVITAMIN WITH MINERALS) TABS tablet Take 1 tablet by mouth daily before breakfast.    Yes [provider]  pantoprazole (PROTONIX) 40 MG tablet Take 40 mg by mouth daily before breakfast.    Yes [provider]  polyvinyl alcohol (ARTIFICIAL TEARS) 1.4 % ophthalmic solution Place 1 drop into both eyes daily.   Yes [provider]  rosuvastatin (CRESTOR) 40 MG tablet Take 40 mg by mouth at bedtime.  06/19/16  Yes [provider]  sucralfate (CARAFATE) 1 g tablet Take 1 g by mouth 4 (four) times daily. 01/18/19  Yes [provider]  warfarin (COUMADIN) 5 MG tablet Take 2.5-5 mg by mouth See admin instructions. Take 1/2 tablet (2.5 mg) by mouth on Thursday evening, take 1 tablet (5 mg) on all other nights of the week   Yes [provider]  HYDROcodone-acetaminophen (NORCO/VICODIN) 5-325 MG tablet Take 1 tablet by mouth every 6 (six) hours as needed for moderate pain. Patient not taking: Reported on 02/17/2019 06/27/16   Milton Ferguson, MD  methocarbamol (ROBAXIN) 500 MG tablet Take 1 tablet (500 mg total) by mouth 2 (two) times daily as needed for muscle  spasms. Patient not taking: Reported on 02/17/2019 05/20/14   Noemi Chapel, MD    Current Facility-Administered Medications  Medication Dose Route Frequency Provider Last Rate Last Admin  . acetaminophen (TYLENOL) tablet 1,000 mg  1,000 mg Oral Q6H PRN Wynetta Fines T, MD      . acidophilus (RISAQUAD) capsule   Oral Daily Wynetta Fines T, MD      . amLODipine (NORVASC) tablet 5 mg  5 mg Oral Daily Lequita Halt, MD      . calcium-vitamin D (OSCAL WITH D) 500-200 MG-UNIT per tablet   Oral BID Le[place]ita Halt, MD   1 tablet at 02/17/19 2148  . Chlorhexidine Gluconate Cloth 2 % PADS 6 each  6 each Topical Daily Wynetta Fines T, MD      . cholecalciferol (VITAMIN D) tablet 5,000 Units  5,000 Units Oral Daily Wynetta Fines T, MD      . docusate sodium (COLACE) capsule 100 mg  100 mg Oral QHS Wynetta Fines T, MD   100 mg at 02/17/19 2148  . [START ON 02/19/2019] ferrous sulfate tablet 325 mg  325 mg Oral Q M,W,F Wynetta Fines T, MD      . hydrochlorothiazide (HYDRODIURIL) tablet 25 mg  25 mg Oral Daily Wynetta Fines T, MD      . insulin aspart (novoLOG) injection 0-15 Units  0-15 Units Subcutaneous TID WC Wynetta Fines T, MD      . insulin aspart (novoLOG) injection 0-5 Units  0-5 Units Subcutaneous QHS Wynetta Fines T, MD      . irbesartan (AVAPRO) tablet 300 mg  300 mg Oral Daily Wynetta Fines T, MD      . loratadine (CLARITIN) tablet 10 mg  10 mg Oral Daily Wynetta Fines T, MD      . methocarbamol (ROBAXIN) tablet 500 mg  500 mg Oral BID PRN Wynetta Fines T, MD      . pantoprazole (PROTONIX) EC tablet 40 mg  40 mg Oral BID AC Wynetta Fines T, MD   40 mg at 02/17/19 1753  . rosuvastatin (CRESTOR) tablet 40 mg  40 mg Oral Daily Wynetta Fines T, MD      . sodium chloride flush (NS) 0.9 % injection 10-40 mL  10-40 mL Intracatheter PRN Lequita Halt, MD        Allergies as of 02/14/2019 - Review Complete 12/16/2016  Allergen Reaction Noted  . Actonel [risedronate sodium] Other (See Comments) 05/20/2014  . Codeine Nausea Only  05/20/2014  . Meloxicam Other (See Comments) 12/02/2015  . Talwin [pentazocine] Other (See Comments) 05/20/2014    No family history on file.  Social History   Socioeconomic History  . Marital status: Divorced    Spouse name: Not on file  . Number of  children: Not on file  . Years of education: Not on file  . Highest education level: Not on file  Occupational History  . Not on file  Tobacco Use  . Smoking status: Former Research scientist (life sciences)  . Smokeless tobacco: Never Used  Substance and Sexual Activity  . Alcohol use: No  . Drug use: No  . Sexual activity: Not on file  Other Topics Concern  . Not on file  Social History Narrative  . Not on file   Social Determinants of Health   Financial Resource Strain:   . Difficulty of Paying Living Expenses: Not on file  Food Insecurity:   . Worried About Charity fundraiser in the Last Year: Not on file  . Ran Out of Food in the Last Year: Not on file  Transportation Needs:   . Lack of Transportation (Medical): Not on file  . Lack of Transportation (Non-Medical): Not on file  Physical Activity:   . Days of Exercise per Week: Not on file  . Minutes of Exercise per Session: Not on file  Stress:   . Feeling of Stress : Not on file  Social Connections:   . Frequency of Communication with Friends and Family: Not on file  . Frequency of Social Gatherings with Friends and Family: Not on file  . Attends Religious Services: Not on file  . Active Member of Clubs or Organizations: Not on file  . Attends Archivist Meetings: Not on file  . Marital Status: Not on file  Intimate Partner Violence:   . Fear of Current or Ex-Partner: Not on file  . Emotionally Abused: Not on file  . Physically Abused: Not on file  . Sexually Abused: Not on file    Review of Systems: Gen: Denies fever, sweats or chills. No weight loss.  CV: Denies chest pain, palpitations or edema. Resp: Denies cough, shortness of breath of hemoptysis.  GI: Denies  heartburn, dysphagia, stomach or lower abdominal pain. No diarrhea or constipation.  GU : Denies urinary burning, blood in urine, increased urinary frequency or incontinence. MS: Denies joint pain, muscles aches or weakness. Derm: Denies rash, itchiness, skin lesions or unhealing ulcers. Psych: Denies depression, anxiety, memory loss, suicidal ideation and confusion. Heme: Denies bruising, bleeding. Neuro:  Denies headaches, dizziness or paresthesias. Endo:  Denies any problems with DM, thyroid or adrenal function.  Physical Exam: Vital signs in last 24 hours: Temp:  [97 F (36.1 C)-98.2 F (36.8 C)] 98.2 F (36.8 C) (01/03 0430) Pulse Rate:  [76-78] 77 (01/03 0430) Resp:  [16-20] 16 (01/03 0430) BP: (134-148)/(63-76) 134/63 (01/03 0430) SpO2:  [98 %-99 %] 98 % (01/03 0430) Weight:  [134.7 kg] 134.7 kg (01/02 1330) Last BM Date: 02/16/19 General:   Obese 74 year old female in NAD. Head:  Normocephalic and atraumatic. Eyes:  Sclera clear, no icterus. Conjunctiva pink. Ears:  Normal auditory acuity. Nose:  No deformity, discharge or lesions. Mouth:  No deformity or lesions.   Neck:  Supple. Lungs:  Breath sounds clear throughout.  Heart:  RRR, no murmur. Abdomen:  Sof Rectal:  Deferred  Msk:  Symmetrical without gross deformities. . Pulses:  Normal pulses noted. Extremities:  Without clubbing or edema. Neurologic:  Alert and  oriented x4;  grossly normal neurologically. Skin:  Intact without significant lesions or rashes.. Psych:  Alert and cooperative. Normal mood and affect.  Intake/Output from previous day: 01/02 0701 - 01/03 0700 In: 240 [P.O.:240] Out: -  Intake/Output this shift: No intake/output  data recorded.  Lab Results: Recent Labs    02/18/19 0419  WBC 4.6  HGB 8.9*  HCT 29.8*  PLT 188   BMET Recent Labs    02/18/19 0419  NA 140  K 3.9  CL 108  CO2 22  GLUCOSE 86  BUN 21  CREATININE 1.66*  CALCIUM 9.2   LFT No results for input(s): PROT,  ALBUMIN, AST, ALT, ALKPHOS, BILITOT, BILIDIR, IBILI in the last 72 hours. PT/INR Recent Labs    02/18/19 0419  LABPROT 25.9*  INR 2.4*   Hepatitis Panel No results for input(s): HEPBSAG, HCVAB, HEPAIGM, HEPBIGM in the last 72 hours.    Studies/Results: No results found.  IMPRESSION/PLAN:  31. 74 year old female admitted to the hospital with chest pain.  -Patient in process of being discharged home, to proceed with cardiac evaluation prior to proceeding with any GI work up.  2. Chronic Anemia. FOBT +.  -Patient will follow up with her GI Dr. Patrick Jupiter Lucus  3. MGUS  4. History of colon polyps   5. History of DVT/PE on  Coumadin   Noralyn Pick  02/18/2019, 6:47 AM    Attending physician's note   I have taken a history, examined the patient and reviewed the chart. I agree with the Advanced Practitioner's note, impression and recommendations.  74 year old female with multiple comorbidities, history of DVT, PE on chronic anticoagulation, CKD and MGUS admitted with chest pain  She has chronic anemia, likely anemia of chronic disease and is multifactorial in addition to occult GI blood loss She has had multiple EGDs and colonoscopy within the last 2 years, negative for any high risk lesion Repeat endoscopic evaluation is likely to be very low yield given lack of overt GI bleeding Continue supportive care, monitor hemoglobin and transfuse as needed Continue Coumadin No recommendation for further GI work-up at this point, will sign off, please call with any questions  K. Denzil Magnuson , MD [phone]

## 2019-02-19 LAB — HEMOGLOBIN A1C
Hgb A1c MFr Bld: 5.4 % (ref 4.8–5.6)
Mean Plasma Glucose: 108 mg/dL

## 2020-05-03 DIAGNOSIS — I160 Hypertensive urgency: Secondary | ICD-10-CM | POA: Insufficient documentation

## 2020-05-03 DIAGNOSIS — I5032 Chronic diastolic (congestive) heart failure: Secondary | ICD-10-CM | POA: Insufficient documentation

## 2020-05-03 DIAGNOSIS — E785 Hyperlipidemia, unspecified: Secondary | ICD-10-CM | POA: Insufficient documentation

## 2020-05-04 DIAGNOSIS — R0600 Dyspnea, unspecified: Secondary | ICD-10-CM | POA: Insufficient documentation

## 2020-05-04 DIAGNOSIS — I2699 Other pulmonary embolism without acute cor pulmonale: Secondary | ICD-10-CM | POA: Insufficient documentation

## 2020-05-04 DIAGNOSIS — I248 Other forms of acute ischemic heart disease: Secondary | ICD-10-CM | POA: Insufficient documentation

## 2020-05-05 DIAGNOSIS — A0472 Enterocolitis due to Clostridium difficile, not specified as recurrent: Secondary | ICD-10-CM | POA: Insufficient documentation

## 2020-05-12 ENCOUNTER — Other Ambulatory Visit: Payer: Self-pay

## 2020-05-12 ENCOUNTER — Ambulatory Visit (INDEPENDENT_AMBULATORY_CARE_PROVIDER_SITE_OTHER): Payer: Medicare PPO | Admitting: Podiatry

## 2020-05-12 ENCOUNTER — Ambulatory Visit (INDEPENDENT_AMBULATORY_CARE_PROVIDER_SITE_OTHER): Payer: Medicare PPO

## 2020-05-12 DIAGNOSIS — M23329 Other meniscus derangements, posterior horn of medial meniscus, unspecified knee: Secondary | ICD-10-CM | POA: Insufficient documentation

## 2020-05-12 DIAGNOSIS — M79672 Pain in left foot: Secondary | ICD-10-CM

## 2020-05-12 DIAGNOSIS — M179 Osteoarthritis of knee, unspecified: Secondary | ICD-10-CM | POA: Insufficient documentation

## 2020-05-12 DIAGNOSIS — G5763 Lesion of plantar nerve, bilateral lower limbs: Secondary | ICD-10-CM

## 2020-05-12 DIAGNOSIS — K219 Gastro-esophageal reflux disease without esophagitis: Secondary | ICD-10-CM | POA: Insufficient documentation

## 2020-05-12 DIAGNOSIS — M542 Cervicalgia: Secondary | ICD-10-CM | POA: Insufficient documentation

## 2020-05-12 DIAGNOSIS — Z86718 Personal history of other venous thrombosis and embolism: Secondary | ICD-10-CM | POA: Insufficient documentation

## 2020-05-12 DIAGNOSIS — M1710 Unilateral primary osteoarthritis, unspecified knee: Secondary | ICD-10-CM | POA: Insufficient documentation

## 2020-05-12 DIAGNOSIS — E611 Iron deficiency: Secondary | ICD-10-CM | POA: Insufficient documentation

## 2020-05-12 DIAGNOSIS — M79671 Pain in right foot: Secondary | ICD-10-CM

## 2020-05-12 DIAGNOSIS — Z8669 Personal history of other diseases of the nervous system and sense organs: Secondary | ICD-10-CM | POA: Insufficient documentation

## 2020-05-12 DIAGNOSIS — J309 Allergic rhinitis, unspecified: Secondary | ICD-10-CM | POA: Insufficient documentation

## 2020-05-12 DIAGNOSIS — E559 Vitamin D deficiency, unspecified: Secondary | ICD-10-CM | POA: Insufficient documentation

## 2020-05-12 DIAGNOSIS — D72819 Decreased white blood cell count, unspecified: Secondary | ICD-10-CM | POA: Insufficient documentation

## 2020-05-12 DIAGNOSIS — D472 Monoclonal gammopathy: Secondary | ICD-10-CM | POA: Insufficient documentation

## 2020-05-12 DIAGNOSIS — R933 Abnormal findings on diagnostic imaging of other parts of digestive tract: Secondary | ICD-10-CM | POA: Insufficient documentation

## 2020-05-12 DIAGNOSIS — S83242S Other tear of medial meniscus, current injury, left knee, sequela: Secondary | ICD-10-CM | POA: Insufficient documentation

## 2020-05-12 DIAGNOSIS — R928 Other abnormal and inconclusive findings on diagnostic imaging of breast: Secondary | ICD-10-CM | POA: Insufficient documentation

## 2020-05-12 MED ORDER — BETAMETHASONE SOD PHOS & ACET 6 (3-3) MG/ML IJ SUSP
6.0000 mg | Freq: Once | INTRAMUSCULAR | Status: AC
  Administered 2020-05-12: 6 mg

## 2020-05-12 NOTE — Progress Notes (Signed)
  Subjective:  Patient ID: Katrina Brady, female    DOB: 02/07/1946,  MRN: [id number]  Chief Complaint  Patient presents with  . Pain    BL plantar forefoot and BL bottom heel pain x 1 mo; 10/10 constant pain (rt>lt) -w/ numbness at toes - no injury/swelling -worse when resting or cover sheets Tx: gaba and warm water soaking    75 y.o. female presents with the above complaint. History confirmed with patient.   Objective:  Physical Exam: warm, good capillary refill, no trophic changes or ulcerative lesions, normal DP and PT pulses and normal sensory exam. Left Foot: tenderness between the 3rd and 4th metatarsal head  Right Foot: tenderness between the 3rd and 4th metatarsal head   No images are attached to the encounter.  Radiographs: X-ray of both feet: no fracture, dislocation, swelling or degenerative changes noted Assessment:   1. Morton's neuroma of both feet   2. Pain in both feet    Plan:  Patient was evaluated and treated and all questions answered.  Morton Neuroma -Educated on etiology -Educated on padding and proper shoegear -XR reviewed with patient -Injection delivered to the affected interspaces  Procedure: Neuroma Injection Location: Bilateral 3rd interspace Skin Prep: Alcohol. Injectate: 0.5 cc 0.5% marcaine plain, 0.5 cc betamethasone acetate-betamethasone sodium phosphate Disposition: Patient tolerated procedure well. Injection site dressed with a band-aid.  Return in about 1 month (around 06/12/2020) for Neuroma, Bilateral.

## 2020-05-12 NOTE — Addendum Note (Signed)
Addended by: Allean Found on: 05/12/2020 10:41 AM   Modules accepted: Orders

## 2020-05-13 ENCOUNTER — Other Ambulatory Visit: Payer: Self-pay | Admitting: Podiatry

## 2020-05-13 DIAGNOSIS — G5763 Lesion of plantar nerve, bilateral lower limbs: Secondary | ICD-10-CM

## 2020-05-23 ENCOUNTER — Telehealth: Payer: Self-pay

## 2020-05-23 NOTE — Telephone Encounter (Signed)
Continue rest, ice, she is now >10 days out from the shot so I doubt it's injection pain. Can't rx anti-inflammatory due to recent AKI. She can take tylenol as well.

## 2020-05-23 NOTE — Telephone Encounter (Signed)
Pt called and LVM stating she has had an increase of Right foot pain after she had the shot. Pt states she has been elevating and taking tylenol and would like to know what to do or take. Please advise further instructions for patient

## 2020-05-27 ENCOUNTER — Telehealth: Payer: Self-pay | Admitting: Podiatry

## 2020-05-27 NOTE — Telephone Encounter (Signed)
Pt called stating she is in a lot of pain-see prev msg-pt was advised to use tylenol which she states is not helping.  Pain has moved from toes to heel and she can't rest anytime day or night  [facility] [place]

## 2020-05-28 NOTE — Telephone Encounter (Signed)
Contacted patient back twice and no answer

## 2020-05-31 DIAGNOSIS — E538 Deficiency of other specified B group vitamins: Secondary | ICD-10-CM | POA: Insufficient documentation

## 2020-06-12 ENCOUNTER — Ambulatory Visit: Payer: Medicare PPO | Admitting: Podiatry

## 2020-06-16 ENCOUNTER — Ambulatory Visit (INDEPENDENT_AMBULATORY_CARE_PROVIDER_SITE_OTHER): Payer: Medicare PPO | Admitting: Podiatry

## 2020-06-16 ENCOUNTER — Other Ambulatory Visit: Payer: Self-pay

## 2020-06-16 DIAGNOSIS — G5763 Lesion of plantar nerve, bilateral lower limbs: Secondary | ICD-10-CM | POA: Diagnosis not present

## 2020-06-16 DIAGNOSIS — M722 Plantar fascial fibromatosis: Secondary | ICD-10-CM | POA: Diagnosis not present

## 2020-06-16 MED ORDER — BETAMETHASONE SOD PHOS & ACET 6 (3-3) MG/ML IJ SUSP
6.0000 mg | Freq: Once | INTRAMUSCULAR | Status: AC
Start: 2020-06-16 — End: 2020-06-16
  Administered 2020-06-16: 6 mg

## 2020-06-16 NOTE — Progress Notes (Signed)
  Subjective:  Patient ID: Katrina Brady, female    DOB: Jan 20, 1946,  MRN: [id number]  Chief Complaint  Patient presents with  . Neuroma    F/U BL neuroma -pt states," had a few days of relief but still having tourble w/ severe pain (Rt>lt) Tx: elevation, icing and tylenol    75 y.o. female presents with the above complaint. History confirmed with patient. New complaint of pain in the right arch area. Thinks the pain is moving.  Objective:  Physical Exam: warm, good capillary refill, no trophic changes or ulcerative lesions, normal DP and PT pulses and normal sensory exam. Left Foot: tenderness between the 3rd and 4th metatarsal head  Right Foot: tenderness between the 3rd and 4th metatarsal head. POP Right medial arch, mild pain at medial calc tuber. Decreased ankle ROM. Assessment:   1. Morton's neuroma of both feet   2. Plantar fasciitis    Plan:  Patient was evaluated and treated and all questions answered.  Morton Neuroma -Repeat Injections as below  Procedure: Neuroma Injection Location: Bilateral 3rd interspace Skin Prep: Alcohol. Injectate: 0.5 cc 0.5% marcaine plain, 0.5 cc betamethasone acetate-betamethasone sodium phosphate Disposition: Patient tolerated procedure well. Injection site dressed with a band-aid.  Plantar Fasciitis -Dispense PF brace.  Return in about 4 weeks (around 07/14/2020) for Neuroma.

## 2020-07-14 DIAGNOSIS — G4733 Obstructive sleep apnea (adult) (pediatric): Secondary | ICD-10-CM | POA: Insufficient documentation

## 2020-07-14 DIAGNOSIS — R1031 Right lower quadrant pain: Secondary | ICD-10-CM | POA: Insufficient documentation

## 2020-07-17 ENCOUNTER — Ambulatory Visit (INDEPENDENT_AMBULATORY_CARE_PROVIDER_SITE_OTHER): Payer: Medicare PPO | Admitting: Podiatry

## 2020-07-17 ENCOUNTER — Encounter: Payer: Self-pay | Admitting: Podiatry

## 2020-07-17 ENCOUNTER — Other Ambulatory Visit: Payer: Self-pay

## 2020-07-17 DIAGNOSIS — M722 Plantar fascial fibromatosis: Secondary | ICD-10-CM | POA: Diagnosis not present

## 2020-07-17 DIAGNOSIS — G5763 Lesion of plantar nerve, bilateral lower limbs: Secondary | ICD-10-CM

## 2020-07-17 NOTE — Progress Notes (Signed)
  Subjective:  Patient ID: Katrina Brady, female    DOB: 09-19-1945,  MRN: [id number]  Chief Complaint  Patient presents with  . Plantar Fasciitis    I am doing better on the heel and the brace helps  . Neuroma    The right is hurting and the left is good   75 y.o. female presents with the above complaint. History confirmed with patient.   Objective:  Physical Exam: warm, good capillary refill, no trophic changes or ulcerative lesions, normal DP and PT pulses and normal sensory exam. Left Foot: tenderness between the 3rd and 4th metatarsal head  Right Foot: tenderness between the 3rd and 4th metatarsal head. POP Right medial arch, mild pain at medial calc tuber. Decreased ankle ROM. Assessment:   1. Morton's neuroma of both feet   2. Plantar fasciitis    Plan:  Patient was evaluated and treated and all questions answered.  Morton Neuroma  -Hold off injection today as pt is getting injections for her knees tomorrow per pt request. -Consider repeat injection next visit right if symptomatic.   Plantar Fasciitis -Ok to d/c out of brace as tolerated.  Return in about 3 weeks (around 08/07/2020).

## 2020-08-04 ENCOUNTER — Other Ambulatory Visit: Payer: Self-pay

## 2020-08-04 ENCOUNTER — Ambulatory Visit (INDEPENDENT_AMBULATORY_CARE_PROVIDER_SITE_OTHER): Payer: Medicare PPO | Admitting: Podiatry

## 2020-08-04 DIAGNOSIS — G5763 Lesion of plantar nerve, bilateral lower limbs: Secondary | ICD-10-CM | POA: Diagnosis not present

## 2020-08-04 MED ORDER — DEXAMETHASONE SODIUM PHOSPHATE 120 MG/30ML IJ SOLN
8.0000 mg | Freq: Once | INTRAMUSCULAR | Status: AC
  Administered 2020-08-04: 8 mg via INTRAMUSCULAR

## 2020-08-04 NOTE — Progress Notes (Signed)
  Subjective:  Patient ID: Katrina Brady, female    DOB: Mar 13, 1945,  MRN: [id number]  Chief Complaint  Patient presents with   Neuroma    F/U BL neuroma -pt states," my feet burn like they're on fine. A lot of pain around toes; 9/10 - worse at night Tx: water soak and massage   75 y.o. female presents with the above complaint. History confirmed with patient.    Objective:  Physical Exam: warm, good capillary refill, no trophic changes or ulcerative lesions, normal DP and PT pulses and normal sensory exam. Left Foot: tenderness between the 3rd and 4th metatarsal head  Right Foot: tenderness between the 3rd and 4th metatarsal head. Assessment:   1. Morton's neuroma of both feet    Plan:  Patient was evaluated and treated and all questions answered.  Morton Neuroma -Repeat injections bilat  Procedure: Neuroma Injection Location: Bilateral 3rd interspace Skin Prep: Alcohol. Injectate: 0.5 cc lidocaine 1% plain, 1 cc dexamethasone Disposition: Patient tolerated procedure well. Injection site dressed with a band-aid.  Plantar Fasciitis -Resolved  No follow-ups on file.

## 2020-08-07 ENCOUNTER — Ambulatory Visit: Payer: Medicare PPO | Admitting: Podiatry

## 2020-09-03 ENCOUNTER — Telehealth: Payer: Self-pay | Admitting: Podiatry

## 2020-09-03 NOTE — Telephone Encounter (Signed)
Unfortuantely I cannot give her any oral anti inflamatory medications since she has chronic kidney disease. Advise her to rest, ice, she can also do epsom salt soaks and take tylenol

## 2020-09-03 NOTE — Telephone Encounter (Signed)
Pt notified and understands/reb °

## 2020-09-03 NOTE — Telephone Encounter (Signed)
Pt had to r/s appt due to covid & still sick-f/u neuroma-states she has burning and Pain and has been using tylenol &  icing but with no relief.  She would like to know what else can she do until her next appt. [place]

## 2020-09-04 ENCOUNTER — Ambulatory Visit: Payer: Medicare PPO | Admitting: Podiatry

## 2020-09-22 ENCOUNTER — Ambulatory Visit: Payer: Medicare PPO | Admitting: Podiatry

## 2021-05-11 ENCOUNTER — Other Ambulatory Visit: Payer: Self-pay

## 2021-05-11 ENCOUNTER — Emergency Department: Payer: Medicare PPO

## 2021-05-11 ENCOUNTER — Inpatient Hospital Stay
Admission: EM | Admit: 2021-05-11 | Discharge: 2021-05-13 | DRG: 683 | Disposition: A | Discharge status: Home / Self Care | Payer: Medicare PPO | Attending: Hospitalist | Admitting: Hospitalist

## 2021-05-11 DIAGNOSIS — Z87891 Personal history of nicotine dependence: Secondary | ICD-10-CM

## 2021-05-11 DIAGNOSIS — I272 Pulmonary hypertension, unspecified: Secondary | ICD-10-CM | POA: Diagnosis present

## 2021-05-11 DIAGNOSIS — I82503 Chronic embolism and thrombosis of unspecified deep veins of lower extremity, bilateral: Secondary | ICD-10-CM | POA: Diagnosis present

## 2021-05-11 DIAGNOSIS — E860 Dehydration: Secondary | ICD-10-CM | POA: Diagnosis present

## 2021-05-11 DIAGNOSIS — Z20822 Contact with and (suspected) exposure to covid-19: Secondary | ICD-10-CM | POA: Diagnosis present

## 2021-05-11 DIAGNOSIS — N1832 Chronic kidney disease, stage 3b: Secondary | ICD-10-CM | POA: Diagnosis present

## 2021-05-11 DIAGNOSIS — Z794 Long term (current) use of insulin: Secondary | ICD-10-CM

## 2021-05-11 DIAGNOSIS — I5032 Chronic diastolic (congestive) heart failure: Secondary | ICD-10-CM | POA: Diagnosis present

## 2021-05-11 DIAGNOSIS — A045 Campylobacter enteritis: Secondary | ICD-10-CM | POA: Diagnosis present

## 2021-05-11 DIAGNOSIS — Z90710 Acquired absence of both cervix and uterus: Secondary | ICD-10-CM

## 2021-05-11 DIAGNOSIS — Z7901 Long term (current) use of anticoagulants: Secondary | ICD-10-CM

## 2021-05-11 DIAGNOSIS — R42 Dizziness and giddiness: Secondary | ICD-10-CM

## 2021-05-11 DIAGNOSIS — K589 Irritable bowel syndrome without diarrhea: Secondary | ICD-10-CM | POA: Diagnosis present

## 2021-05-11 DIAGNOSIS — G4733 Obstructive sleep apnea (adult) (pediatric): Secondary | ICD-10-CM | POA: Diagnosis present

## 2021-05-11 DIAGNOSIS — E538 Deficiency of other specified B group vitamins: Secondary | ICD-10-CM | POA: Diagnosis present

## 2021-05-11 DIAGNOSIS — E1165 Type 2 diabetes mellitus with hyperglycemia: Secondary | ICD-10-CM | POA: Diagnosis present

## 2021-05-11 DIAGNOSIS — R197 Diarrhea, unspecified: Secondary | ICD-10-CM

## 2021-05-11 DIAGNOSIS — I248 Other forms of acute ischemic heart disease: Secondary | ICD-10-CM | POA: Diagnosis present

## 2021-05-11 DIAGNOSIS — N179 Acute kidney failure, unspecified: Principal | ICD-10-CM | POA: Diagnosis present

## 2021-05-11 DIAGNOSIS — A0811 Acute gastroenteropathy due to Norwalk agent: Secondary | ICD-10-CM | POA: Diagnosis present

## 2021-05-11 DIAGNOSIS — R778 Other specified abnormalities of plasma proteins: Secondary | ICD-10-CM

## 2021-05-11 DIAGNOSIS — D6959 Other secondary thrombocytopenia: Secondary | ICD-10-CM | POA: Diagnosis present

## 2021-05-11 DIAGNOSIS — E559 Vitamin D deficiency, unspecified: Secondary | ICD-10-CM | POA: Diagnosis present

## 2021-05-11 DIAGNOSIS — I130 Hypertensive heart and chronic kidney disease with heart failure and stage 1 through stage 4 chronic kidney disease, or unspecified chronic kidney disease: Secondary | ICD-10-CM | POA: Diagnosis present

## 2021-05-11 DIAGNOSIS — D631 Anemia in chronic kidney disease: Secondary | ICD-10-CM | POA: Diagnosis present

## 2021-05-11 DIAGNOSIS — D638 Anemia in other chronic diseases classified elsewhere: Secondary | ICD-10-CM

## 2021-05-11 DIAGNOSIS — D696 Thrombocytopenia, unspecified: Secondary | ICD-10-CM

## 2021-05-11 DIAGNOSIS — R55 Syncope and collapse: Principal | ICD-10-CM

## 2021-05-11 DIAGNOSIS — R739 Hyperglycemia, unspecified: Secondary | ICD-10-CM

## 2021-05-11 DIAGNOSIS — E1122 Type 2 diabetes mellitus with diabetic chronic kidney disease: Secondary | ICD-10-CM | POA: Diagnosis present

## 2021-05-11 DIAGNOSIS — Z79899 Other long term (current) drug therapy: Secondary | ICD-10-CM

## 2021-05-11 DIAGNOSIS — M109 Gout, unspecified: Secondary | ICD-10-CM | POA: Diagnosis present

## 2021-05-11 DIAGNOSIS — K219 Gastro-esophageal reflux disease without esophagitis: Secondary | ICD-10-CM | POA: Diagnosis present

## 2021-05-11 DIAGNOSIS — Z86711 Personal history of pulmonary embolism: Secondary | ICD-10-CM

## 2021-05-11 HISTORY — DX: Type 2 diabetes mellitus without complications: E11.9

## 2021-05-11 LAB — CBC
HCT: 35.1 % — ABNORMAL LOW (ref 36.0–46.0)
Hemoglobin: 10.7 g/dL — ABNORMAL LOW (ref 12.0–15.0)
MCH: 22.7 pg — ABNORMAL LOW (ref 26.0–34.0)
MCHC: 30.5 g/dL (ref 30.0–36.0)
MCV: 74.5 fL — ABNORMAL LOW (ref 80.0–100.0)
Platelets: 201 10*3/uL (ref 150–400)
RBC: 4.71 MIL/uL (ref 3.87–5.11)
RDW: 17.9 % — ABNORMAL HIGH (ref 11.5–15.5)
WBC: 8 10*3/uL (ref 4.0–10.5)
nRBC: 0 % (ref 0.0–0.2)

## 2021-05-11 LAB — COMPREHENSIVE METABOLIC PANEL
ALT: 17 U/L (ref 0–44)
AST: 19 U/L (ref 15–41)
Albumin: 3.4 g/dL — ABNORMAL LOW (ref 3.5–5.0)
Alkaline Phosphatase: 91 U/L (ref 38–126)
Anion gap: 11 (ref 5–15)
BUN: 48 mg/dL — ABNORMAL HIGH (ref 8–23)
CO2: 24 mmol/L (ref 22–32)
Calcium: 9.5 mg/dL (ref 8.9–10.3)
Chloride: 102 mmol/L (ref 98–111)
Creatinine, Ser: 2.38 mg/dL — ABNORMAL HIGH (ref 0.44–1.00)
GFR, Estimated: 21 mL/min — ABNORMAL LOW (ref >60)
Glucose, Bld: 240 mg/dL — ABNORMAL HIGH (ref 70–99)
Potassium: 4.6 mmol/L (ref 3.5–5.1)
Sodium: 137 mmol/L (ref 135–145)
Total Bilirubin: 0.8 mg/dL (ref 0.3–1.2)
Total Protein: 6.7 g/dL (ref 6.5–8.1)

## 2021-05-11 LAB — RESP PANEL BY RT-PCR (FLU A&B, COVID) ARPGX2
Influenza A by PCR: NEGATIVE
Influenza B by PCR: NEGATIVE
SARS Coronavirus 2 by RT PCR: NEGATIVE

## 2021-05-11 LAB — TROPONIN I (HIGH SENSITIVITY)
Troponin I (High Sensitivity): 98 ng/L — ABNORMAL HIGH (ref <18)
Troponin I (High Sensitivity): 105 ng/L (ref <18)
Troponin I (High Sensitivity): 109 ng/L (ref <18)

## 2021-05-11 LAB — GLUCOSE, CAPILLARY: Glucose-Capillary: 290 mg/dL — ABNORMAL HIGH (ref 70–99)

## 2021-05-11 MED ORDER — HYDRALAZINE HCL 50 MG PO TABS
75.0000 mg | ORAL_TABLET | Freq: Three times a day (TID) | ORAL | Status: DC
  Administered 2021-05-11 – 2021-05-13 (×5): 75 mg via ORAL
  Filled 2021-05-11 (×5): qty 1

## 2021-05-11 MED ORDER — ACETAMINOPHEN 325 MG PO TABS: 650.0000 mg | ORAL_TABLET | Freq: Four times a day (QID) | ORAL | Status: DC | PRN

## 2021-05-11 MED ORDER — ALBUTEROL SULFATE (2.5 MG/3ML) 0.083% IN NEBU: 3.0000 mL | INHALATION_SOLUTION | RESPIRATORY_TRACT | Status: DC | PRN

## 2021-05-11 MED ORDER — ONDANSETRON HCL 4 MG/2ML IJ SOLN: 4.0000 mg | Freq: Four times a day (QID) | INTRAMUSCULAR | Status: DC | PRN

## 2021-05-11 MED ORDER — ONDANSETRON HCL 4 MG PO TABS: 4.0000 mg | ORAL_TABLET | Freq: Four times a day (QID) | ORAL | Status: DC | PRN

## 2021-05-11 MED ORDER — ROSUVASTATIN CALCIUM 10 MG PO TABS
40.0000 mg | ORAL_TABLET | Freq: Every day | ORAL | Status: DC
  Administered 2021-05-11 – 2021-05-12 (×2): 40 mg via ORAL
  Filled 2021-05-11 (×2): qty 4
  Filled 2021-05-11: qty 2

## 2021-05-11 MED ORDER — ACETAMINOPHEN 650 MG RE SUPP: 650.0000 mg | Freq: Four times a day (QID) | RECTAL | Status: DC | PRN

## 2021-05-11 MED ORDER — INSULIN ASPART 100 UNIT/ML IJ SOLN
0.0000 [IU] | Freq: Three times a day (TID) | INTRAMUSCULAR | Status: DC
  Administered 2021-05-12 – 2021-05-13 (×4): 2 [IU] via SUBCUTANEOUS
  Filled 2021-05-11 (×4): qty 1

## 2021-05-11 MED ORDER — ALLOPURINOL 100 MG PO TABS: 200.0000 mg | ORAL_TABLET | Freq: Every day | ORAL | Status: DC

## 2021-05-11 MED ORDER — ASPIRIN 81 MG PO CHEW
324.0000 mg | CHEWABLE_TABLET | Freq: Once | ORAL | Status: DC
  Filled 2021-05-11: qty 4

## 2021-05-11 MED ORDER — SODIUM BICARBONATE 650 MG PO TABS
650.0000 mg | ORAL_TABLET | Freq: Two times a day (BID) | ORAL | Status: DC
Start: 2021-05-11 — End: 2021-05-13
  Administered 2021-05-11 – 2021-05-13 (×4): 650 mg via ORAL
  Filled 2021-05-11 (×5): qty 1

## 2021-05-11 MED ORDER — INSULIN GLARGINE-YFGN 100 UNIT/ML ~~LOC~~ SOLN
6.0000 [IU] | Freq: Every day | SUBCUTANEOUS | Status: DC
  Administered 2021-05-11: 6 [IU] via SUBCUTANEOUS
  Filled 2021-05-11: qty 0.06

## 2021-05-11 MED ORDER — ISOSORBIDE DINITRATE 10 MG PO TABS
10.0000 mg | ORAL_TABLET | Freq: Three times a day (TID) | ORAL | Status: DC
  Administered 2021-05-11 – 2021-05-13 (×5): 10 mg via ORAL
  Filled 2021-05-11 (×6): qty 1

## 2021-05-11 MED ORDER — PANTOPRAZOLE SODIUM 40 MG PO TBEC
40.0000 mg | DELAYED_RELEASE_TABLET | Freq: Every day | ORAL | Status: DC
  Administered 2021-05-12 – 2021-05-13 (×2): 40 mg via ORAL
  Filled 2021-05-11 (×2): qty 1

## 2021-05-11 MED ORDER — CARVEDILOL 25 MG PO TABS
25.0000 mg | ORAL_TABLET | Freq: Two times a day (BID) | ORAL | Status: DC
  Administered 2021-05-11 – 2021-05-13 (×4): 25 mg via ORAL
  Filled 2021-05-11 (×2): qty 1
  Filled 2021-05-11: qty 4
  Filled 2021-05-11: qty 1

## 2021-05-11 MED ORDER — SODIUM CHLORIDE 0.9 % IV BOLUS
1000.0000 mL | Freq: Once | INTRAVENOUS | Status: AC
  Administered 2021-05-11: 1000 mL via INTRAVENOUS

## 2021-05-11 MED ORDER — EZETIMIBE 10 MG PO TABS
10.0000 mg | ORAL_TABLET | Freq: Every day | ORAL | Status: DC
Start: 2021-05-12 — End: 2021-05-13
  Administered 2021-05-12 – 2021-05-13 (×2): 10 mg via ORAL
  Filled 2021-05-11 (×2): qty 1

## 2021-05-11 MED ORDER — APIXABAN 2.5 MG PO TABS
2.5000 mg | ORAL_TABLET | Freq: Two times a day (BID) | ORAL | Status: DC
  Administered 2021-05-11 – 2021-05-13 (×4): 2.5 mg via ORAL
  Filled 2021-05-11 (×5): qty 1

## 2021-05-11 MED ORDER — SODIUM CHLORIDE 0.9 % IV SOLN: INTRAVENOUS | Status: AC

## 2021-05-11 NOTE — Plan of Care (Signed)
  Problem: Clinical Measurements: Goal: Respiratory complications will improve Outcome: Progressing   

## 2021-05-11 NOTE — Progress Notes (Signed)
Date and time results received: 95/28/41 3244 from [place] ? ? ?Test: troponin  ?Critical Value: 105 ? ?Name of Provider Notified: Neomia Glass NP ? ?Orders Received? Or Actions Taken?: Orders Received - See Orders for details ?

## 2021-05-11 NOTE — H&P (Signed)
?History and Physical  ? ? ?Patient: Katrina Brady IRW:[id number] DOB: [birth year] ?DOA: 05/11/2021 ?DOS: the patient was seen and examined on 05/11/2021 ?PCP: Patient, No Pcp Per (Inactive)  ?Patient coming from: Home ? ?Chief Complaint:  ?Chief Complaint  ?Patient presents with  ? Hyperglycemia  ? ?HPI: Katrina Brady is a 76 y.o. 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  She was noted to be have elevated creatinine and was admitted for AKI ? ?Patient states that she had steroid injection to the knee last week and since then her sugars have been very high.  Notes that they have not registered on her glucometer.  She went to see her PCP last week who put her on Tresiba which she has been taking.  However she has not been feeling well, she has been feeling weak and tired.  Last night she skipped her dinner for fear of having further hyperglycemia.  At this morning patient was at [employer] and had episode of presyncope associated with voluminous watery diarrhea which caused her to soil her pants.  EMS was called and she notes she had diarrhea all the way to the ED. ? ?At present patient states she feels okay although still weak and tired.  She is relieved that her sugars are not all that elevated.  She denies recent fevers or chills.  She does admit to diarrhea as noted above with intermittent cramping abdominal pain which is improved with the diarrhea.  She does have some nausea but no vomiting.  Patient thinks she may have some left-sided chest pain but is not really sure.  No fevers or chills.  Denies any sick contacts.  Her only new medication is the  [place]. ? ?Of note patient was admitted to [facility] on 03/01/2021 with acute left-sided chest pain.  At that time she was noted to have hypertensive urgency which was treated with medications.  She had a small troponin bump at the time which was thought to be secondary to hypertensive urgency.  It was noted she had a negative cardiac catheterization in August 17, 2020 which showed no CAD.  It was noted in discharge summary that patient's baseline creatinine is 1.4. ? ? ?Review of Systems: As mentioned in the history of present illness. All other systems reviewed and are negative. ?Past Medical History:  ?Diagnosis Date  ? Arthritis   ? Asthma   ? Blood transfusion without reported diagnosis   ? Diabetes mellitus without complication ([place])   ? Hypertension   ? ?Past Surgical History:  ?Procedure Laterality Date  ? ABDOMINAL HYSTERECTOMY    ? APPENDECTOMY    ? CHOLECYSTECTOMY    ? HAND SURGERY    ? KNEE SURGERY Bilateral   ? ?Social History:  reports that she has quit smoking. She has never used smokeless tobacco. She reports that she does not drink alcohol and does not use drugs. ? ?Allergies  ?Allergen Reactions  ? Actonel [Risedronate Sodium] Other (See Comments)  ?  Chest pain ?  ? Codeine Nausea Only  ? Meloxicam Other (See Comments)  ?  Chest tightness   ? Spironolactone Nausea And Vomiting  ? Talwin [Pentazocine] Other (See Comments)  ?  hallucination  ? Amlodipine Swelling  ?  edema ?  ? ? ?No family history on file. ? ?Prior to Admission medications   ?Medication Sig Start Date End Date Taking? Authorizing Provider  ?Accu-Chek Softclix Lancets lancets  04/28/20   [provider]  ?acetaminophen (TYLENOL) 500 MG tablet Take 1,000 mg by mouth every 6 (six) hours as needed for headache (pain).    [provider]  ?albuterol (VENTOLIN HFA) 108 (90 Base) MCG/ACT inhaler Inhale 2 puffs into the lungs every 4 (four) hours as needed for wheezing or shortness of breath.     [provider]   ?allopurinol (ZYLOPRIM) 100 MG tablet Take 200 mg by mouth daily with breakfast.  01/23/19   [provider]  ?apixaban (ELIQUIS) 5 MG TABS tablet Take by mouth. 05/09/20 06/15/20  [provider]  ?ascorbic acid (VITAMIN C) 500 MG tablet Take by mouth.    [provider]  ?carboxymethylcellulose 1 % ophthalmic solution Administer 1 drop into both eyes nightly.    [provider]  ?carvedilol (COREG) 3.125 MG tablet Take 3.125-6.25 mg by mouth See admin instructions. Take 2 tablets (6.25 mg) by mouth daily with breakfast and 1 tablet (3.125 mg) with supper 01/28/19   [provider]  ?cetirizine (ZYRTEC) 10 MG tablet Take 10 mg by mouth at bedtime.     [provider]  ?Cholecalciferol (VITAMIN D-3) 5000 units TABS Take 5,000 Units by mouth daily.    [provider]  ?ezetimibe (ZETIA) 10 MG tablet Take 10 mg by mouth daily with breakfast. 01/01/19   [provider]  ?fluticasone (FLONASE) 50 MCG/ACT nasal spray Place into the nose.    [provider]  ?furosemide (LASIX) 40 MG tablet Take by mouth.    [provider]  ?hydrALAZINE (APRESOLINE) 25 MG tablet Take 75 mg by mouth 3 (three) times daily. 05/01/21   [provider]  ?hydrALAZINE (APRESOLINE) 50 MG tablet Take by mouth. 11/20/19   [provider]  ?isosorbide dinitrate (ISORDIL) 10 MG tablet Take 10 mg by mouth 3 (three) times daily. 05/10/21   [provider]  ?Magnesium Oxide 200 MG TABS Take by mouth.    [provider]  ?pantoprazole (PROTONIX) 40 MG tablet Take 40 mg by mouth daily before breakfast.     [provider]  ?rosuvastatin (CRESTOR) 40 MG tablet Take 40 mg by mouth at bedtime.  06/19/16   [provider]  ?sodium bicarbonate 650 MG tablet Take by mouth. 10/29/19   [provider]  ?TRESIBA FLEXTOUCH 200 UNIT/ML FlexTouch Pen Inject 35 Units into the skin daily. 05/10/21   [provider]   ?vancomycin (VANCOCIN) 125 MG capsule Take 125 mg by mouth 4 (four) times daily.    [provider]  ?vitamin B-12 (CYANOCOBALAMIN) 1000 MCG tablet Take 1,000 mcg by mouth daily.    [provider]  ? ? ?Physical Exam: ?Vitals:  ? 05/11/21 1104 05/11/21 1107 05/11/21 1330 05/11/21 1500  ?BP: 125/63   126/88  ?Pulse: 67  69 73  ?Resp: 17   16  ?Temp:  97.7 ?F (36.5 ?C)    ?TempSrc: Oral     ?SpO2: 100%  99% 98%  ? ?Physical Exam: ?Blood pressure 126/88, pulse 73, temperature 97.7 ?F (36.5 ?C), resp. rate 16, SpO2 98 %. ?Gen: Tired appearing female lying flat in stretcher having IV placed in NAD ?Eyes: sclera anicteric, conjuctiva mildly injected bilaterally ?CVS: S1-S2, regulary, no gallops ?Respiratory:  decreased air entry likely secondary to decreased inspiratory effort ?GI: NABS, firm but not hard, nontender ?LE: 1+ edema bilaterally ?Neuro: A/O x 3, grossly nonfocal.  ?Psych:  mood and affect appropriate to situation. ? ? ?Data Reviewed: ? ?Laboratory data is notable for a glucose of 240, chloride 24 and anion gap of 11, creatinine of 2.4 and troponin of 98. EKG is sinus rhythm at 70, IVCD favoring RBBB pattern, no acute ST-T wave changes. ? ?Assessment and Plan: ?No notes have been filed under this hospital service. ?Service: Hospitalist ? ?Presyncope  ?Sounds like vagal episode in setting of severe diarrhea. ?Patient is normotensive here ?I do not think any further work-up is warranted at this time, follow BP ? ?Sudden onset voluminous diarrhea ?Unclear etiology, patient without fevers or chills. ?Stool PCR ordered ?Strict ins and outs ordered ?C. difficile not ordered given duration of less than 24 hours and further work-up has not been done.  Patient was last hospitalized 3 months ago. ? ?AKI on baseline CKD 3B ?I suspect patient is intravascularly volume depleted given likely polyuria from several days of hyperglycemia as well as recent voluminous diarrhea. ?Will hydrate modestly at  normal saline 100 cc an hour and repeat creatinine in the morning. ?Baseline creat 1.4 per dc summary [facility] 03/01/21 ? ?DM 2 with recent significant hyperglycemia ?Patient was started on Tresiba of unknown

## 2021-05-11 NOTE — Progress Notes (Signed)
Ultrasound to Rt arm - 1 small vein visualized anteriorly - unable to finish threading. Same on Lt arm. Able to provide Iv access to RT A/C with # 20, 1.88" catheter. RN & admitting MD aware.  ?

## 2021-05-11 NOTE — ED Triage Notes (Signed)
Pt states she has a hx of DM but has not needed medication in over a year, states she went for knee pain on Saturday and was started on prednisone, and started feeling bad and checked her CBG was reading high, called her PCP yesterday and they started her back on the insulin , states since Saturday having diarrhea and today while out broke out in a sweat and having watery diarrhea, pt is in NAD on arrival, EMS reported CBG 239 ?

## 2021-05-11 NOTE — ED Provider Notes (Signed)
? ?[facility] ?Provider Note ? ? ? Event Date/Time  ? First MD Initiated Contact with Patient 05/11/21 1125   ?  (approximate) ? ? ?History  ? ?Hyperglycemia ? ? ?HPI ? ?Murl Zogg is a 76 y.o. female with past medical history of diabetes, hypertension, asthma and anemia secondary to GI bleeding presents after a presyncopal episode.  Patient tells me that she was given steroids for knee pain several days ago.  She took the first 3 pills but then her blood sugar was elevated so she stopped.  Was feeling generally unwell including fatigue and some lower abdominal discomfort.  Then today she was at [place] [employer] standing when she suddenly felt lightheaded sweaty and nauseated and nearly passed out.  She had to have a bowel movement at the same time.  She does endorse some chest pressure since waking today which is rather constant nonexertional nonpleuritic. ?  ? ?Past Medical History:  ?Diagnosis Date  ? Arthritis   ? Asthma   ? Blood transfusion without reported diagnosis   ? Diabetes mellitus without complication ([place])   ? Hypertension   ? ? ?Patient Active Problem List  ? Diagnosis Date Noted  ? OSA (obstructive sleep apnea) 07/14/2020  ? Right lower quadrant abdominal pain 07/14/2020  ? Vitamin B12 deficiency 05/31/2020  ? Abnormal finding on mammography 05/12/2020  ? Abnormal findings on diagnostic imaging of digestive system 05/12/2020  ? Acute medial meniscal tear, left, sequela 05/12/2020  ? Allergic rhinitis 05/12/2020  ? Chronic GERD 05/12/2020  ? Derangement of posterior horn of medial meniscus 05/12/2020  ? History of migraine 05/12/2020  ? History of thromboembolism of vein 05/12/2020  ? Iron deficiency 05/12/2020  ? Leukopenia 05/12/2020  ? Monoclonal gammopathy of undetermined significance 05/12/2020  ? Morbid obesity ([place]) 05/12/2020  ? Neck pain 05/12/2020  ? Osteoarthritis of knee 05/12/2020  ? Vitamin D deficiency 05/12/2020  ? C. difficile colitis 05/05/2020  ? Acute  pulmonary embolism without acute cor pulmonale ([place]) 05/04/2020  ? Demand ischemia ([place]) 05/04/2020  ? Dyspnea 05/04/2020  ? Chronic diastolic (congestive) heart failure ([place]) 05/03/2020  ? Hyperlipidemia 05/03/2020  ? Hypertensive urgency 05/03/2020  ? Chest pain 02/18/2019  ? Anemia due to chronic kidney disease 02/17/2019  ? GI bleeding 02/17/2019  ? Acute hyperkalemia 12/29/2018  ? Gout 12/29/2018  ? Spinal stenosis, lumbar 09/12/2018  ? Cervical radicular pain 08/15/2018  ? Chronic right shoulder pain 08/15/2018  ? Hypertension 02/23/2018  ? 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 Type 2 diabetes mellitus with diabetic chronic kidney disease ([place]) 02/23/2018  ? Osteoarthritis of right hip 07/08/2017  ? Hypomagnesemia 12/01/2016  ? Chronic kidney disease, stage 3b ([place]) 11/30/2016  ? Chronic thromboembolism of deep vein of both lower extremities ([place]) 11/29/2016  ? Epigastric pain 11/29/2016  ? History of adenomatous polyp of colon 06/01/2016  ? Chronic left-sided low back pain with left-sided sciatica 12/23/2015  ? Osteoarthritis of spine with radiculopathy, lumbosacral region 12/23/2015  ? History of pulmonary embolism 12/02/2015  ? Long term current use of anticoagulant therapy 12/02/2015  ? Microcytic anemia 10/24/2015  ? Murmur, cardiac 10/24/2015  ? ? ? ?Physical Exam  ?Triage Vital Signs: ?ED Triage Vitals  ?Enc Vitals Group  ?   BP 05/11/21 1104 125/63  ?   Pulse Rate 05/11/21 1104 67  ?   Resp 05/11/21 1104 17  ?   Temp 05/11/21 1107 97.7 ?F (36.5 ?C)  ?   Temp Source 05/11/21 1104 Oral  ?  SpO2 05/11/21 1104 100 %  ?   Weight --   ?   Height --   ?   Head Circumference --   ?   Peak Flow --   ?   Pain Score 05/11/21 1105 0  ?   Pain Loc --   ?   Pain Edu? --   ?   Excl. in [place]? --   ? ? ?Most recent vital signs: ?Vitals:  ? 05/11/21 1330 05/11/21 1500  ?BP:  126/88  ?Pulse: 69 73  ?Resp:  16  ?Temp:    ?SpO2: 99% 98%  ? ? ? ?General: Awake, no distress.  ?CV:  Good peripheral perfusion.  ?Resp:  Normal  effort.  ?Abd:  No distention. Mild ttp in bilateral lower quadrants  ?Neuro:             Awake, Alert, Oriented x 3  ?Other:  Brown stool on rectal exam ? ? ?ED Results / Procedures / Treatments  ?Labs ?(all labs ordered are listed, but only abnormal results are displayed) ?Labs Reviewed  ?CBC - Abnormal; Notable for the following components:  ?    Result Value  ? Hemoglobin 10.7 (*)   ? HCT 35.1 (*)   ? MCV 74.5 (*)   ? MCH 22.7 (*)   ? RDW 17.9 (*)   ? All other components within normal limits  ?COMPREHENSIVE METABOLIC PANEL - Abnormal; Notable for the following components:  ? Glucose, Bld 240 (*)   ? BUN 48 (*)   ? Creatinine, Ser 2.38 (*)   ? Albumin 3.4 (*)   ? GFR, Estimated 21 (*)   ? All other components within normal limits  ?TROPONIN I (HIGH SENSITIVITY) - Abnormal; Notable for the following components:  ? Troponin I (High Sensitivity) 98 (*)   ? All other components within normal limits  ?RESP PANEL BY RT-PCR (FLU A&B, COVID) ARPGX2  ?URINALYSIS, ROUTINE W REFLEX MICROSCOPIC  ?CBG MONITORING, ED  ?CBG MONITORING, ED  ?TROPONIN I (HIGH SENSITIVITY)  ? ? ? ?EKG ? ?EKG interpreted by myself, left axis deviation, normal sinus rhythm ? ? ?RADIOLOGY ?I reviewed the CXR which does not show any acute cardiopulmonary process; agree with radiology report  ? ? ? ?PROCEDURES: ? ?Critical Care performed: No ? ?.1-3 Lead EKG Interpretation ?Performed by: Rada Hay, MD ?Authorized by: Rada Hay, MD  ? ?  Interpretation: normal   ?  ECG rate assessment: normal   ?  Ectopy: none   ?  Conduction: normal   ? ?The patient is on the cardiac monitor to evaluate for evidence of arrhythmia and/or significant heart rate changes. ? ? ?MEDICATIONS ORDERED IN ED: ?Medications  ?aspirin chewable tablet 324 mg (has no administration in time range)  ?sodium chloride 0.9 % bolus 1,000 mL (0 mLs Intravenous Stopped 05/11/21 1427)  ? ? ? ?IMPRESSION / MDM / ASSESSMENT AND PLAN / ED COURSE  ?I reviewed the triage vital  signs and the nursing notes. ?             ?               ? ?Differential diagnosis includes, but is not limited to DKA, HHS, dehydration, vasovagal syncope, ACS, anemia, GI bleed ? ?Patient is a 76 year old female past medical history of hypertension diabetes anemia secondary to GI bleed and pulmonary embolism on Eliquis who presents with a near syncopal episode.  This is in the setting of her being hypoglycemic  at home likely due to taking steroids.  Also endorsed chest pressure this morning which is currently gone.  Denies dyspnea.  She was standing in line when she suddenly felt warm nauseous and then had a presyncopal episode.  Also had an episode of diarrhea.  Her labs are notable for troponin of 98, EKG does not show any obvious ischemic changes.  Globin is 10.7 which is actually improved from prior.  Blood sugar 240 with no anion gap to suggest DKA.  Has mild AKI with a creatinine of 2.4 from baseline around 1.6.  She was given a liter fluid bolus.  Also 324 of aspirin for the elevated troponin.  She is not having chest pain currently so we will repeat the troponin but hold on heparin for now.  Obtained a CT abdomen pelvis without contrast given her low GFR because of some lower abdominal pain which is negative for acute pathology.  Chest x-ray also without infiltrate.  Overall I suspect that patient may have had some volume depletion secondary to her hyperglycemia causing her to have vasovagal versus orthostatic syncope today.  However with her chest pain elevated troponin do feel that she requires admission for ? ?  ? ? ?FINAL CLINICAL IMPRESSION(S) / ED DIAGNOSES  ? ?Final diagnoses:  ?Near syncope  ?Hyperglycemia  ?Elevated troponin  ? ? ? ?Rx / DC Orders  ? ?ED Discharge Orders   ? ? None  ? ?  ? ? ? ?Note:  This document was prepared using Dragon voice recognition software and may include unintentional dictation errors. ?  ?Rada Hay, MD ?05/11/21 1617 ? ?

## 2021-05-11 NOTE — ED Triage Notes (Signed)
First nurse Note:  C/O hyperglycemia all weekend.  Today while at [facility] today, felt like she was going to pass out and was incontinent of diarrhea. ? ?AAOx3.  Skin warm and dry. NAD ?

## 2021-05-12 DIAGNOSIS — A09 Infectious gastroenteritis and colitis, unspecified: Secondary | ICD-10-CM

## 2021-05-12 DIAGNOSIS — K219 Gastro-esophageal reflux disease without esophagitis: Secondary | ICD-10-CM | POA: Diagnosis present

## 2021-05-12 DIAGNOSIS — N1832 Chronic kidney disease, stage 3b: Secondary | ICD-10-CM

## 2021-05-12 DIAGNOSIS — D631 Anemia in chronic kidney disease: Secondary | ICD-10-CM | POA: Diagnosis present

## 2021-05-12 DIAGNOSIS — I130 Hypertensive heart and chronic kidney disease with heart failure and stage 1 through stage 4 chronic kidney disease, or unspecified chronic kidney disease: Secondary | ICD-10-CM | POA: Diagnosis present

## 2021-05-12 DIAGNOSIS — M109 Gout, unspecified: Secondary | ICD-10-CM | POA: Diagnosis present

## 2021-05-12 DIAGNOSIS — Z90710 Acquired absence of both cervix and uterus: Secondary | ICD-10-CM | POA: Diagnosis not present

## 2021-05-12 DIAGNOSIS — N170 Acute kidney failure with tubular necrosis: Secondary | ICD-10-CM | POA: Diagnosis not present

## 2021-05-12 DIAGNOSIS — R55 Syncope and collapse: Secondary | ICD-10-CM

## 2021-05-12 DIAGNOSIS — R778 Other specified abnormalities of plasma proteins: Secondary | ICD-10-CM

## 2021-05-12 DIAGNOSIS — E1122 Type 2 diabetes mellitus with diabetic chronic kidney disease: Secondary | ICD-10-CM | POA: Diagnosis present

## 2021-05-12 DIAGNOSIS — K589 Irritable bowel syndrome without diarrhea: Secondary | ICD-10-CM | POA: Diagnosis present

## 2021-05-12 DIAGNOSIS — E559 Vitamin D deficiency, unspecified: Secondary | ICD-10-CM | POA: Diagnosis present

## 2021-05-12 DIAGNOSIS — D696 Thrombocytopenia, unspecified: Secondary | ICD-10-CM

## 2021-05-12 DIAGNOSIS — I5032 Chronic diastolic (congestive) heart failure: Secondary | ICD-10-CM | POA: Diagnosis present

## 2021-05-12 DIAGNOSIS — R197 Diarrhea, unspecified: Secondary | ICD-10-CM

## 2021-05-12 DIAGNOSIS — E860 Dehydration: Secondary | ICD-10-CM | POA: Diagnosis present

## 2021-05-12 DIAGNOSIS — Z86711 Personal history of pulmonary embolism: Secondary | ICD-10-CM | POA: Diagnosis not present

## 2021-05-12 DIAGNOSIS — I272 Pulmonary hypertension, unspecified: Secondary | ICD-10-CM | POA: Diagnosis present

## 2021-05-12 DIAGNOSIS — D638 Anemia in other chronic diseases classified elsewhere: Secondary | ICD-10-CM | POA: Diagnosis not present

## 2021-05-12 DIAGNOSIS — E1165 Type 2 diabetes mellitus with hyperglycemia: Secondary | ICD-10-CM | POA: Diagnosis present

## 2021-05-12 DIAGNOSIS — N179 Acute kidney failure, unspecified: Secondary | ICD-10-CM | POA: Diagnosis present

## 2021-05-12 DIAGNOSIS — I248 Other forms of acute ischemic heart disease: Secondary | ICD-10-CM | POA: Diagnosis present

## 2021-05-12 DIAGNOSIS — A045 Campylobacter enteritis: Secondary | ICD-10-CM | POA: Diagnosis present

## 2021-05-12 DIAGNOSIS — Z87891 Personal history of nicotine dependence: Secondary | ICD-10-CM | POA: Diagnosis not present

## 2021-05-12 DIAGNOSIS — N189 Chronic kidney disease, unspecified: Secondary | ICD-10-CM | POA: Diagnosis present

## 2021-05-12 DIAGNOSIS — Z79899 Other long term (current) drug therapy: Secondary | ICD-10-CM | POA: Diagnosis not present

## 2021-05-12 DIAGNOSIS — Z7901 Long term (current) use of anticoagulants: Secondary | ICD-10-CM | POA: Diagnosis not present

## 2021-05-12 DIAGNOSIS — E538 Deficiency of other specified B group vitamins: Secondary | ICD-10-CM | POA: Diagnosis present

## 2021-05-12 DIAGNOSIS — A0811 Acute gastroenteropathy due to Norwalk agent: Secondary | ICD-10-CM | POA: Diagnosis present

## 2021-05-12 DIAGNOSIS — D6959 Other secondary thrombocytopenia: Secondary | ICD-10-CM | POA: Diagnosis present

## 2021-05-12 DIAGNOSIS — Z20822 Contact with and (suspected) exposure to covid-19: Secondary | ICD-10-CM | POA: Diagnosis present

## 2021-05-12 LAB — URINALYSIS, ROUTINE W REFLEX MICROSCOPIC
Bilirubin Urine: NEGATIVE
Glucose, UA: 50 mg/dL — AB
Hgb urine dipstick: NEGATIVE
Ketones, ur: NEGATIVE mg/dL
Nitrite: NEGATIVE
Protein, ur: NEGATIVE mg/dL
Specific Gravity, Urine: 1.017 (ref 1.005–1.030)
pH: 5 (ref 5.0–8.0)

## 2021-05-12 LAB — GASTROINTESTINAL PANEL BY PCR, STOOL (REPLACES STOOL CULTURE)
Adenovirus F40/41: NOT DETECTED
Astrovirus: NOT DETECTED
Campylobacter species: DETECTED — AB
Cryptosporidium: NOT DETECTED
Cyclospora cayetanensis: NOT DETECTED
Entamoeba histolytica: NOT DETECTED
Enteroaggregative E coli (EAEC): NOT DETECTED
Enteropathogenic E coli (EPEC): NOT DETECTED
Enterotoxigenic E coli (ETEC): NOT DETECTED
Giardia lamblia: NOT DETECTED
Norovirus GI/GII: DETECTED — AB
Plesimonas shigelloides: NOT DETECTED
Rotavirus A: NOT DETECTED
Salmonella species: NOT DETECTED
Sapovirus (I, II, IV, and V): NOT DETECTED
Shiga like toxin producing E coli (STEC): NOT DETECTED
Shigella/Enteroinvasive E coli (EIEC): NOT DETECTED
Vibrio cholerae: NOT DETECTED
Vibrio species: NOT DETECTED
Yersinia enterocolitica: NOT DETECTED

## 2021-05-12 LAB — CBC
HCT: 27.7 % — ABNORMAL LOW (ref 36.0–46.0)
Hemoglobin: 8.7 g/dL — ABNORMAL LOW (ref 12.0–15.0)
MCH: 23.2 pg — ABNORMAL LOW (ref 26.0–34.0)
MCHC: 31.4 g/dL (ref 30.0–36.0)
MCV: 73.9 fL — ABNORMAL LOW (ref 80.0–100.0)
Platelets: 144 10*3/uL — ABNORMAL LOW (ref 150–400)
RBC: 3.75 MIL/uL — ABNORMAL LOW (ref 3.87–5.11)
RDW: 17.6 % — ABNORMAL HIGH (ref 11.5–15.5)
WBC: 6.7 10*3/uL (ref 4.0–10.5)
nRBC: 0 % (ref 0.0–0.2)

## 2021-05-12 LAB — BASIC METABOLIC PANEL
Anion gap: 4 — ABNORMAL LOW (ref 5–15)
BUN: 50 mg/dL — ABNORMAL HIGH (ref 8–23)
CO2: 25 mmol/L (ref 22–32)
Calcium: 8.4 mg/dL — ABNORMAL LOW (ref 8.9–10.3)
Chloride: 108 mmol/L (ref 98–111)
Creatinine, Ser: 2.21 mg/dL — ABNORMAL HIGH (ref 0.44–1.00)
GFR, Estimated: 23 mL/min — ABNORMAL LOW (ref >60)
Glucose, Bld: 223 mg/dL — ABNORMAL HIGH (ref 70–99)
Potassium: 3.8 mmol/L (ref 3.5–5.1)
Sodium: 137 mmol/L (ref 135–145)

## 2021-05-12 LAB — IRON AND TIBC
Iron: 51 ug/dL (ref 28–170)
Saturation Ratios: 22 % (ref 10.4–31.8)
TIBC: 232 ug/dL — ABNORMAL LOW (ref 250–450)
UIBC: 181 ug/dL

## 2021-05-12 LAB — GLUCOSE, CAPILLARY
Glucose-Capillary: 163 mg/dL — ABNORMAL HIGH (ref 70–99)
Glucose-Capillary: 182 mg/dL — ABNORMAL HIGH (ref 70–99)
Glucose-Capillary: 192 mg/dL — ABNORMAL HIGH (ref 70–99)
Glucose-Capillary: 259 mg/dL — ABNORMAL HIGH (ref 70–99)

## 2021-05-12 LAB — VITAMIN B12: Vitamin B-12: 1422 pg/mL — ABNORMAL HIGH (ref 180–914)

## 2021-05-12 LAB — TROPONIN I (HIGH SENSITIVITY)
Troponin I (High Sensitivity): 109 ng/L (ref <18)
Troponin I (High Sensitivity): 103 ng/L (ref <18)

## 2021-05-12 MED ORDER — AZITHROMYCIN 250 MG PO TABS
500.0000 mg | ORAL_TABLET | Freq: Every day | ORAL | Status: DC
  Administered 2021-05-12: 500 mg via ORAL
  Filled 2021-05-12 (×2): qty 2

## 2021-05-12 MED ORDER — INSULIN GLARGINE-YFGN 100 UNIT/ML ~~LOC~~ SOLN
6.0000 [IU] | Freq: Every day | SUBCUTANEOUS | Status: DC
  Administered 2021-05-12: 6 [IU] via SUBCUTANEOUS
  Filled 2021-05-12: qty 0.06

## 2021-05-12 MED ORDER — INSULIN GLARGINE-YFGN 100 UNIT/ML ~~LOC~~ SOLN: 26.0000 [IU] | Freq: Every day | SUBCUTANEOUS | Status: DC

## 2021-05-12 NOTE — Assessment & Plan Note (Signed)
Mild thrombocytopenia.  Recheck a CBC tomorrow ?

## 2021-05-12 NOTE — Assessment & Plan Note (Signed)
Likely due to demand ischemia due to second dehydration.  Did not have any chest pain.  We will follow. ?

## 2021-05-12 NOTE — Progress Notes (Signed)
Date and time results received: 05/12/21 0005 ? ? ?Test: troponin ?Critical Value: 109 ? ?Name of Provider Notified: Neomia Glass NP ? ?Orders Received? Or Actions Taken?: Orders Received - See Orders for details ?

## 2021-05-12 NOTE — Assessment & Plan Note (Signed)
Check iron B12 level.  Continue to follow. ?

## 2021-05-12 NOTE — Progress Notes (Addendum)
?  Progress Note ? ? ?PatientDalayla Brady KGY:[id number] DOB: [birth year] DOA: 05/11/2021     0 ?DOS: the patient was seen and examined on 05/12/2021 ?  ?Brief hospital course: ?No notes on file ? ?Assessment and Plan: ?* Acute renal failure superimposed on stage 3b chronic kidney disease ([place]) ?This is secondary to large diarrhea with dehydration.  Renal function is better, still not at baseline.  Continue another day IV fluids. ? ?Thrombocytopenia ([place]) ?Mild thrombocytopenia.  Recheck a CBC tomorrow ? ?Elevated troponin ?Likely due to demand ischemia due to second dehydration.  Did not have any chest pain.  We will follow. ? ?Diarrhea ?No additional diarrhea since admission. ? ?Postural dizziness with presyncope ?Condition improved after giving IV fluids. ? ?Chronic diastolic (congestive) heart failure ([facility]) ?No exacerbation of congestive heart failure, patient currently is volume depleted.  Continue gentle rehydration. ? ?Uncontrolled type 2 diabetes mellitus with hyperglycemia, with long-term current use of insulin ([place]) ?Increase the dose of insulin glargine, continue sliding scale insulin. ? 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 ? ?Anemia of chronic disease ?Check iron B12 level.  Continue to follow. ? ? ?Addendum: 1540 Stool positive for Campylobacter and norovirus.  We will treat with Zithromax. ? ?  ? ?Subjective:  ?Patient feels better today, no additional diarrhea since admission. ?Denies any short of breath or cough. ?No dysuria hematuria. ? ?Physical Exam: ?Vitals:  ? 05/11/21 2033 05/12/21 0032 05/12/21 0263 05/12/21 7858  ?BP: (!) 196/88 (!) 145/66 (!) 148/66 (!) 146/62  ?Pulse: 73 67 63 63  ?Resp: '20 20 14 18  '$ ?Temp: 98.3 ?F (36.8 ?C) 98.5 ?F (36.9 ?C) 97.9 ?F (36.6 ?C) 97.9 ?F (36.6 ?C)  ?TempSrc: Oral Oral Oral   ?SpO2: 99% 98% 100% 100%  ?Weight: 116.1 kg     ?Height: '5\' 3"'$  (1.6 m)     ? ?General exam: Appears calm and comfortable  ?Respiratory  system: Clear to auscultation. Respiratory effort normal. ?Cardiovascular system: S1 & S2 heard, RRR. No JVD, murmurs, rubs, gallops or clicks. No pedal edema. ?Gastrointestinal system: Abdomen is nondistended, soft and nontender. No organomegaly or masses felt. Normal bowel sounds heard. ?Central nervous system: Alert and oriented. No focal neurological deficits. ?Extremities: Symmetric 5 x 5 power. ?Skin: No rashes, lesions or ulcers ?Psychiatry: Judgement and insight appear normal. Mood & affect appropriate.  ? ?Data Reviewed: ? ?Reviewed chest x-ray and a CT scan of the abdomen pelvis. ?Reviewed all lab results. ? ?Family Communication:  ? ?Disposition: ?Status is: Observation ?The patient will require care spanning > 2 midnights and should be moved to inpatient because: Severity of disease and IV treatment ? Planned Discharge Destination: Home ? ? ? ?Time spent: 35 minutes ? ?Author: ?Sharen Hones, MD ?05/12/2021 10:57 AM ? ?For on call review [email or web address].  ?

## 2021-05-12 NOTE — Assessment & Plan Note (Signed)
Condition improved after giving IV fluids. ?

## 2021-05-12 NOTE — Assessment & Plan Note (Signed)
No additional diarrhea since admission. ?

## 2021-05-12 NOTE — Assessment & Plan Note (Signed)
Increase the dose of insulin glargine, continue sliding scale insulin. ?

## 2021-05-12 NOTE — Assessment & Plan Note (Signed)
This is secondary to large diarrhea with dehydration.  Renal function is better, still not at baseline.  Continue another day IV fluids. ?

## 2021-05-12 NOTE — Assessment & Plan Note (Signed)
No exacerbation of congestive heart failure, patient currently is volume depleted.  Continue gentle rehydration. ?

## 2021-05-12 NOTE — Progress Notes (Addendum)
Inpatient Diabetes Program Recommendations ? ?AACE/ADA: New Consensus Statement on Inpatient Glycemic Control  ? ?Target Ranges:  Prepandial:   less than 140 mg/dL ?     Peak postprandial:   less than 180 mg/dL (1-2 hours) ?     Critically ill patients:  140 - 180 mg/dL  ? ? Latest Reference Range & Units 05/11/21 20:36 05/12/21 08:10  ?Glucose-Capillary 70 - 99 mg/dL 290 (H) 163 (H)  ? ?Review of Glycemic Control ? ?Diabetes history: DM2 ?Outpatient Diabetes medications: Tresiba 35 units daily ?Current orders for Inpatient glycemic control: Semglee 6 units QHS, Novolog 0-9 units TID with meals ? ?Inpatient Diabetes Program Recommendations:   ? ?HbgA1C: Noted in Care Everywhere that A1C was 5.1% on 03/02/21 indicating an average glucose of 100 mg/dl over the past 2-3 months. ? ?NOTE: Per H&P, patient reports that she received a "steroid injection to the knee last week and since her sugars have been very high.  Notes that they have not registered on her glucometer.  She went to see her PCP last week who put her on Tresiba which she has been taking." Noted in [place] that patient was seen at Urgent Care on 05/09/21 and prescribed Medrol dose pack.  ? ?Addendum 05/12/21'@12'$ :15-Spoke with patient at bedside. Patient reports that she has had DM2 for a long time and she checks glucose at home. Patient does not usually take any DM medication and last A1C was 5.1% on 03/02/21.  Patient reports that she did NOT get a steroid injection in her knee but she did go to Urgent Care on 05/09/21 and was prescribed Medrol dose pack. Patient reports that she took 3 of the pills on 05/09/21 that morning. She states when she checked glucose that evening that glucometer read "HI" so she was afraid to take the other 3 pills from the Medrol dose pack (she was suppose to take 6 pills on 05/09/21). Patient reports that prior to the Medrol, her glucose is usually 70-80's in the morning and 120-150's after eating.  Patient states that Sunday  morning on 05/10/21 her glucose was still high so she went to PCP office and she was told to start taking [place]. Patient reports that the provider told her to take Tresiba 20 units on Sunday. Patient reports that she was not really told what to take specifically after Sunday. On Monday morning her glucose was 418 mg/dl so she looked at prescription label on Tresiba and it said to take 35 units so she took 35 units Monday morning.  Patient then states she had to drop off her car for service and she called her daughter because she did not feel good. Patient thought maybe she was not feeling good because she had not eaten so her daughter took her to [place] to eat. She states she ate food there and then went to the bathroom there and had diarrhea, she was weak and felt as though she could not get up, was sweating profusely, felt confused, and felt like she was going to pass out. She used the phone of another person in restroom to call her grandson to come in the restroom and get her. From there, 911 was called and patient was brought to ED. Patient states she has never experienced hypoglycemia before so unclear if symptoms were due to low glucose or not. Discussed impact of steroids on glucose and that steroids may cause hyperglycemia for a couple of days after last dose. Explained that once steroids are completely  out of her system, glucose should go back to her baseline and she will likely not need any DM medication. Discussed hypoglycemia and symptoms. Asked patient to be sure to call nursing staff if she has any symptoms of hypoglycemia so her glucose could be checked and treated if necessary. Discussed current orders of Semglee and Novolog correction.  Patient verbalized understanding of information discussed and states she has no questions at this time. ? ?Thanks, ?Barnie Alderman, RN, MSN, CDE ?Diabetes Coordinator ?Inpatient Diabetes Program ?[phone] (Team Pager from 8am to 5pm) ? ? ? ? ?

## 2021-05-12 NOTE — Assessment & Plan Note (Signed)
Continue anticoagulation 

## 2021-05-13 LAB — HEMOGLOBIN A1C
Hgb A1c MFr Bld: 8.8 % — ABNORMAL HIGH (ref 4.8–5.6)
Mean Plasma Glucose: 206 mg/dL

## 2021-05-13 LAB — BASIC METABOLIC PANEL
Anion gap: 7 (ref 5–15)
BUN: 39 mg/dL — ABNORMAL HIGH (ref 8–23)
CO2: 18 mmol/L — ABNORMAL LOW (ref 22–32)
Calcium: 7.9 mg/dL — ABNORMAL LOW (ref 8.9–10.3)
Chloride: 111 mmol/L (ref 98–111)
Creatinine, Ser: 1.64 mg/dL — ABNORMAL HIGH (ref 0.44–1.00)
GFR, Estimated: 32 mL/min — ABNORMAL LOW (ref >60)
Glucose, Bld: 206 mg/dL — ABNORMAL HIGH (ref 70–99)
Potassium: 3.9 mmol/L (ref 3.5–5.1)
Sodium: 136 mmol/L (ref 135–145)

## 2021-05-13 LAB — CBC WITH DIFFERENTIAL/PLATELET
Abs Immature Granulocytes: 0.05 10*3/uL (ref 0.00–0.07)
Basophils Absolute: 0 10*3/uL (ref 0.0–0.1)
Basophils Relative: 0 %
Eosinophils Absolute: 0 10*3/uL (ref 0.0–0.5)
Eosinophils Relative: 1 %
HCT: 28.3 % — ABNORMAL LOW (ref 36.0–46.0)
Hemoglobin: 8.7 g/dL — ABNORMAL LOW (ref 12.0–15.0)
Immature Granulocytes: 1 %
Lymphocytes Relative: 17 %
Lymphs Abs: 1 10*3/uL (ref 0.7–4.0)
MCH: 22.6 pg — ABNORMAL LOW (ref 26.0–34.0)
MCHC: 30.7 g/dL (ref 30.0–36.0)
MCV: 73.5 fL — ABNORMAL LOW (ref 80.0–100.0)
Monocytes Absolute: 0.3 10*3/uL (ref 0.1–1.0)
Monocytes Relative: 6 %
Neutro Abs: 4.5 10*3/uL (ref 1.7–7.7)
Neutrophils Relative %: 75 %
Platelets: 140 10*3/uL — ABNORMAL LOW (ref 150–400)
RBC: 3.85 MIL/uL — ABNORMAL LOW (ref 3.87–5.11)
RDW: 17.8 % — ABNORMAL HIGH (ref 11.5–15.5)
WBC: 6 10*3/uL (ref 4.0–10.5)
nRBC: 0 % (ref 0.0–0.2)

## 2021-05-13 LAB — GLUCOSE, CAPILLARY
Glucose-Capillary: 193 mg/dL — ABNORMAL HIGH (ref 70–99)
Glucose-Capillary: 236 mg/dL — ABNORMAL HIGH (ref 70–99)

## 2021-05-13 LAB — MAGNESIUM: Magnesium: 1.7 mg/dL (ref 1.7–2.4)

## 2021-05-13 MED ORDER — AZITHROMYCIN 500 MG PO TABS: 500.0000 mg | ORAL_TABLET | Freq: Every day | ORAL | 0 refills | Status: AC

## 2021-05-13 MED ORDER — TRESIBA FLEXTOUCH 200 UNIT/ML ~~LOC~~ SOPN: 6.0000 [IU] | PEN_INJECTOR | Freq: Every day | SUBCUTANEOUS | Status: AC

## 2021-05-13 MED ORDER — AZITHROMYCIN 250 MG PO TABS
1000.0000 mg | ORAL_TABLET | Freq: Once | ORAL | Status: AC
  Administered 2021-05-13: 1000 mg via ORAL
  Filled 2021-05-13: qty 4

## 2021-05-13 MED ORDER — FUROSEMIDE 40 MG PO TABS
ORAL_TABLET | ORAL | Status: AC
Start: 2021-05-13 — End: ?

## 2021-05-13 NOTE — Discharge Summary (Signed)
? ?Physician Discharge Summary ? ? ?[name]  female DOB: Jun 27, 1945  ?[id number] ? ?PCP: Wandalee Ferdinand, MD ? ?Admit date: 05/11/2021 ?Discharge date: 05/13/2021 ? ?Admitted From: home ?Disposition:  home ?CODE STATUS: Full code ? ?Discharge Instructions   ? ? Discharge instructions   Complete by: As directed ?  ? You have Campylobacter and Norovirus in your stool which was causing your diarrhea.  Please finish 2 more days of azithromycin starting 3/30 at home. ? ?Your outpatient provider had prescribed you Tresiba insulin prior to hospitalization.  Please reduce the dose and take just 6 units daily and follow up with your PCP in 1 week. ? ? ?Dr. Enzo Bi ?- ?-  ? ?  ? ?Hospital Course:  ?For full details, please see H&P, progress notes, consult notes and ancillary notes.  ?Briefly,  ?Katrina Brady is a 76 y.o. 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  She was noted to be have elevated creatinine and was admitted for AKI ?  ?Patient states that she was prescribed steroid for her knee pain last week and since then her sugars have been very high.  She went to see her PCP last week who put her on Tresiba which she has been taking.  Morning of presentation, patient was at [place] [facility] and had episode of presyncope associated with voluminous watery diarrhea which caused her to soil her pants.  EMS was called and she notes she had diarrhea all the way to the ED. ? ?* Acute renal failure superimposed on stage 3b chronic kidney disease ([place]) ?--Cr 2.38 on presentation.  Baseline around 1.6.  This is secondary to large diarrhea with dehydration.  Cr improved back to baseline after  IVF. ?--home lasix held pending PCP f/u. ? ?Gastroenteritis 2/2 Campylobacter and Norovirus infection ?--diarrhea improved prior to discharge.  Pt will complete 5 days of azithromycin at home. ?  ?Thrombocytopenia ([place]) ?Mild thrombocytopenia.  Unknown clinical significance  ?  ?Elevated troponin 2/2 demand ischemia ?--trop 100's, flat.  No chest pain. ?  ?Postural dizziness with presyncope ?Condition improved after giving IV fluids. ?  ?Chronic diastolic (congestive) heart failure (HCC) ?No exacerbation of congestive heart failure, patient was volume depleted on presentation. ?  ?Uncontrolled type 2 diabetes mellitus with hyperglycemia ?--Pt said she didn't have a hx of DM2, but developed elevated BG after she started taking steroid.   ?--A1c 8.8.   ?--Pt was prescribed Tresiba 35u daily, however, pt didn't require that much insulin.  Pt was discharged on Tresiba 6u daily instead, and to continue f/u with PCP. ?  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  ?Anemia of chronic disease ?iron B12 level wnl. ? ?HTN ?--cont coreg, hydralazine, Imdur. ?--home lasix held pending PCP f/u. ? ? ?Discharge Diagnoses:  ?Principal Problem: ?  Acute renal failure superimposed on stage 3b chronic kidney disease ([place]) ?Active Problems: ?  Anemia of chronic disease ?  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  Uncontrolled type 2 diabetes mellitus with hyperglycemia, with long-term current use of insulin ([place]) ?  Chronic diastolic (congestive) heart failure (HCC) ?  Postural dizziness with presyncope ?  Diarrhea ?  Elevated troponin ?  Thrombocytopenia ([place]) ?  Acute renal failure superimposed on chronic kidney disease ([place]) ? ? ?30 Day Unplanned Readmission Risk Score   ? ?Flowsheet Row ED to Hosp-Admission (Current) from 05/11/2021 in [facility]  ?30 Day Unplanned Readmission Risk Score (%) 23.89 Filed at 05/13/2021 0801  ? ?  ? ?  This score is the patient's risk of an unplanned readmission within 30 days of being discharged (0 -100%). The score is based on dignosis, age, lab data, medications, orders, and past utilization.   ?Low:  0-14.9   Medium: 15-21.9   High: 22-29.9   Extreme: 30 and above ? ?  ? ?  ? ? ?Discharge Instructions: ? ?Allergies as of 05/13/2021   ? ?   Reactions  ? Actonel [risedronate Sodium] Other (See Comments)  ? Chest pain  ? Codeine Nausea Only  ? Meloxicam Other (See Comments)  ? Chest tightness   ? Spironolactone Nausea And Vomiting  ? Talwin [pentazocine] Other (See Comments)  ? hallucination  ? Amlodipine Swelling  ? edema  ? ?  ? ?  ?Medication List  ?  ? ?STOP taking these medications   ? ?methylPREDNISolone 4 MG Tbpk tablet ?Commonly known as: MEDROL DOSEPAK ?  ?vancomycin 125 MG capsule ?Commonly known as: VANCOCIN ?  ? ?  ? ?TAKE these medications   ? ?Accu-Chek Softclix Lancets lancets ?  ?acetaminophen 500 MG tablet ?Commonly known as: TYLENOL ?Take 1,000 mg by mouth every 6 (six) hours as needed for headache (pain). ?  ?albuterol 108 (90 Base) MCG/ACT inhaler ?Commonly known as: VENTOLIN HFA ?Inhale 2 puffs into the lungs every 4 (four) hours as needed for wheezing or shortness of breath. ?  ?allopurinol 100 MG tablet ?Commonly known as: ZYLOPRIM ?Take 200 mg by mouth daily with breakfast. ?  ?apixaban 5 MG Tabs tablet ?Commonly known as: ELIQUIS ?Take 5 mg by mouth 2 (two) times daily. ?  ?ascorbic acid 500 MG tablet ?Commonly known as: VITAMIN C ?Take 500 mg by mouth daily. ?  ?azithromycin 500 MG tablet ?Commonly known as: Zithromax ?Take 1 tablet (500 mg total) by mouth daily for 2 days. ?Start taking on: May 14, 2021 ?  ?carboxymethylcellulose 1 % ophthalmic solution ?Administer 1 drop into both eyes nightly. ?  ?carvedilol 25 MG tablet ?Commonly known as: COREG ?Take 25 mg by mouth 2 (two) times daily with a meal. ?  ?cetirizine 10 MG tablet ?Commonly known as: ZYRTEC ?Take 10 mg by mouth at  bedtime. ?  ?cyanocobalamin 1000 MCG/ML injection ?Commonly known as: (VITAMIN B-12) ?Inject 1,000 mcg into the muscle every 30 (thirty) days. ?What changed: Another medication with the same name was removed. Continue taking this medication, and follow the directions you see here. ?  ?ezetimibe 10 MG tablet ?Commonly known as: ZETIA ?Take 10 mg by mouth daily with breakfast. ?  ?fluticasone 50 MCG/ACT nasal spray ?Commonly known as: FLONASE ?Place 1 spray into the nose daily as needed for allergies or rhinitis. ?  ?furosemide 40 MG tablet ?Commonly known as: LASIX ?Hold until followup with outpatient provider due to acute kidney injury. ?What changed:  ?how much to take ?how to take this ?when to take this ?additional instructions ?  ?hydrALAZINE 25 MG tablet ?Commonly known as: APRESOLINE ?Take 75 mg by mouth 3 (three) times daily. ?What changed: Another medication with the same name was removed. Continue taking this medication, and follow the directions you see here. ?  ?  isosorbide dinitrate 10 MG tablet ?Commonly known as: ISORDIL ?Take 10 mg by mouth 3 (three) times daily. ?  ?Magnesium Oxide 200 MG Tabs ?Take 200 mg by mouth daily. ?  ?pantoprazole 40 MG tablet ?Commonly known as: PROTONIX ?Take 40 mg by mouth daily before breakfast. ?  ?rosuvastatin 40 MG tablet ?Commonly known as: CRESTOR ?Take 40 mg by mouth at bedtime. ?  ?sodium bicarbonate 650 MG tablet ?Take 650 mg by mouth 2 (two) times daily. ?  ?Tyler Aas FlexTouch 200 UNIT/ML FlexTouch Pen ?Generic drug: insulin degludec ?Inject 6 Units into the skin daily. ?What changed: how much to take ?  ?Vitamin D-3 125 MCG (5000 UT) Tabs ?Take 5,000 Units by mouth daily. ?  ? ?  ? ?  ?  ? ? ?  ?Durable Medical Equipment  ?(From admission, onward)  ?  ? ? ?  ? ?  Start     Ordered  ? 05/13/21 1015  For home use only DME Shower stool  Once       ? 05/13/21 1014  ? ?  ?  ? ?  ? ? ? Follow-up Information   ? ? Wandalee Ferdinand, MD Follow up in 1 week(s).   ?Specialty:  Internal Medicine ?Contact information: ?[address] ?[place] [address] ?[phone] ? ? ?  ?  ? ?  ?  ? ?  ? ? ?Allergies  ?Allergen Reactions  ? Actonel [Risedronate Sodium] Other (See Comments)  ?  Chest pa

## 2021-05-13 NOTE — TOC Initial Note (Signed)
Transition of Care (TOC) - Initial/Assessment Note  ? ? ?Patient Details  ?Name: Katrina Brady ?MRN: [id number] ?Date of Birth: October 01, 1945 ? ?Transition of Care (TOC) CM/SW Contact:    ?Candie Chroman, LCSW ?Phone Number: ?05/13/2021, 10:16 AM ? ?Clinical Narrative:   Readmission prevention screen complete. CSW met with patient. No supports at bedside. CSW introduced role and explained that discharge planning would be discussed. Patient lives in the [place] area and was here getting her car fixed. Her sister lives in [place]. She is planning on returning home once she discharges. PCP is Jonathon Bellows, MD at [facility]. Patient drives herself to appointments. Pharmacy is [facility] in [place] and she would like prescriptions sent to this pharmacy at discharge. No issues obtaining medications. No home health prior to admission. She does have telehealth visits from a heart failure nurse. Patient weighs herself every morning and checks her BP and pulse ox. Patient has a walker and cane at home but is requesting a shower stool. They issued their last shower stool yesterday and new shipment isn't coming in until tomorrow so may need to ship it to her home if discharged today. Daughter will pick her up at discharge. No further concerns. CSW encouraged patient to contact CSW as needed. CSW will continue to follow patient for support and facilitate return home when stable.             ? ?Expected Discharge Plan: Home/Self Care ?Barriers to Discharge: Continued Medical Work up ? ? ?Patient Goals and CMS Choice ?  ?  ?  ? ?Expected Discharge Plan and Services ?Expected Discharge Plan: Home/Self Care ?  ?  ?Post Acute Care Choice: Durable Medical Equipment ?Living arrangements for the past 2 months: [place] ?                ?DME Arranged: Shower stool ?DME Agency: [facility] ?Date DME Agency Contacted: 05/13/21 ?  ?Representative spoke with at DME Agency: Suanne Marker ?  ?  ?  ?  ?  ? ?Prior Living  Arrangements/Services ?Living arrangements for the past 2 months: [place] ?  ?Patient language and need for interpreter reviewed:: Yes ?Do you feel safe going back to the place where you live?: Yes      ?Need for Family Participation in Patient Care: Yes (Comment) ?  ?Current home services: DME ?Criminal Activity/Legal Involvement Pertinent to Current Situation/Hospitalization: No - Comment as needed ? ?Activities of Daily Living ?Home Assistive Devices/Equipment: Blood pressure cuff, CBG Meter, Cane (specify quad or straight), Walker (specify type) ?ADL Screening (condition at time of admission) ?Patient's cognitive ability adequate to safely complete daily activities?: Yes ?Is the patient deaf or have difficulty hearing?: No ?Does the patient have difficulty seeing, even when wearing glasses/contacts?: No ?Does the patient have difficulty concentrating, remembering, or making decisions?: No ?Patient able to express need for assistance with ADLs?: Yes ?Does the patient have difficulty dressing or bathing?: No ?Independently performs ADLs?: Yes (appropriate for developmental age) ?Does the patient have difficulty walking or climbing stairs?: Yes ?Weakness of Legs: Both ?Weakness of Arms/Hands: None ? ?Permission Sought/Granted ?  ?  ?   ?   ?   ?   ? ?Emotional Assessment ?Appearance:: Appears stated age ?Attitude/Demeanor/Rapport: Engaged, Gracious ?Affect (typically observed): Accepting, Appropriate, Calm, Pleasant ?Orientation: : Oriented to Self, Oriented to Place, Oriented to  Time, Oriented to Situation ?Alcohol / Substance Use: Not Applicable ?Psych Involvement: No (comment) ? ?Admission diagnosis:  Hyperglycemia [R73.9] ?  Elevated troponin [R77.8] ?AKI (acute kidney injury) ([place]) [N17.9] ?Near syncope [R55] ?Acute renal failure superimposed on chronic kidney disease ([place]) [N17.9, N18.9] ?Patient Active Problem List  ? Diagnosis Date Noted  ? Postural dizziness with presyncope 05/12/2021  ? Diarrhea  05/12/2021  ? Elevated troponin 05/12/2021  ? Thrombocytopenia ([place]) 05/12/2021  ? Acute renal failure superimposed on chronic kidney disease ([place]) 05/12/2021  ? Acute renal failure superimposed on stage 3b chronic kidney disease ([place]) 05/11/2021  ? OSA (obstructive sleep apnea) 07/14/2020  ? Right lower quadrant abdominal pain 07/14/2020  ? Vitamin B12 deficiency 05/31/2020  ? Abnormal finding on mammography 05/12/2020  ? Abnormal findings on diagnostic imaging of digestive system 05/12/2020  ? Acute medial meniscal tear, left, sequela 05/12/2020  ? Allergic rhinitis 05/12/2020  ? Chronic GERD 05/12/2020  ? Derangement of posterior horn of medial meniscus 05/12/2020  ? History of migraine 05/12/2020  ? History of thromboembolism of vein 05/12/2020  ? Iron deficiency 05/12/2020  ? Leukopenia 05/12/2020  ? Monoclonal gammopathy of undetermined significance 05/12/2020  ? Morbid obesity ([place]) 05/12/2020  ? Neck pain 05/12/2020  ? Osteoarthritis of knee 05/12/2020  ? Vitamin D deficiency 05/12/2020  ? C. difficile colitis 05/05/2020  ? Acute pulmonary embolism without acute cor pulmonale ([place]) 05/04/2020  ? Demand ischemia ([place]) 05/04/2020  ? Dyspnea 05/04/2020  ? Chronic diastolic (congestive) heart failure ([place]) 05/03/2020  ? Hyperlipidemia 05/03/2020  ? Hypertensive urgency 05/03/2020  ? Chest pain 02/18/2019  ? Anemia of chronic disease 02/17/2019  ? GI bleeding 02/17/2019  ? Acute hyperkalemia 12/29/2018  ? Gout 12/29/2018  ? Spinal stenosis, lumbar 09/12/2018  ? Cervical radicular pain 08/15/2018  ? Chronic right shoulder pain 08/15/2018  ? Hypertension 02/23/2018  ? 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 Uncontrolled type 2 diabetes mellitus with hyperglycemia, with long-term current use of insulin ([place]) 02/23/2018  ? Osteoarthritis of right hip 07/08/2017  ? Hypomagnesemia 12/01/2016  ? Chronic kidney disease, stage 3b ([place]) 11/30/2016  ? 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 Epigastric pain 11/29/2016  ? History of adenomatous polyp of colon 06/01/2016  ? Chronic left-sided low back pain with left-sided sciatica 12/23/2015  ? Osteoarthritis of spine with radiculopathy, lumbosacral region 12/23/2015  ? History of pulmonary embolism 12/02/2015  ? Long term current use of anticoagulant therapy 12/02/2015  ? Microcytic anemia 10/24/2015  ? Murmur, cardiac 10/24/2015  ? ?PCP:  Wandalee Ferdinand, MD ?Pharmacy:   ?[facility] [place] ?[place] ?[place] [address] ?Phone: [phone] Fax: [phone] ? ?[place], [address] ?[address] ?[place] [address] ?Phone: [phone] Fax: [phone] ? ? ? ? ?Social Determinants of Health (SDOH) Interventions ?  ? ?Readmission Risk Interventions ? ?  05/13/2021  ? 10:13 AM  ?Readmission Risk Prevention Plan  ?Transportation Screening Complete  ?PCP or Specialist Appt within 3-5 Days Complete  ?Social Work Consult for [place] Planning/Counseling Complete  ?Palliative Care Screening Not Applicable  ?Medication Review Press photographer) Complete  ? ? ? ?

## 2021-05-13 NOTE — Progress Notes (Addendum)
Inpatient Diabetes Program Recommendations ? ?AACE/ADA: New Consensus Statement on Inpatient Glycemic Control  ? ?Target Ranges:  Prepandial:   less than 140 mg/dL ?     Peak postprandial:   less than 180 mg/dL (1-2 hours) ?     Critically ill patients:  140 - 180 mg/dL  ? ? Latest Reference Range & Units 05/12/21 08:10 05/12/21 11:59 05/12/21 17:13 05/12/21 20:07 05/13/21 07:45  ?Glucose-Capillary 70 - 99 mg/dL 163 (H) 182 (H) 192 (H) 259 (H) 193 (H)  ? ? Latest Reference Range & Units 05/11/21 19:23  ?Hemoglobin A1C 4.8 - 5.6 % 8.8 (H)  ? ?Review of Glycemic Control ? ?Diabetes history: DM2 ?Outpatient Diabetes medications: Tresiba 35 units daily (just started taking Tresiba 05/10/21 after developing hyperglycemia with starting Medrol dose pack; prior to that she was not taking any DM medications) ?Current orders for Inpatient glycemic control: Semglee 6 units QHS, Novolog 0-9 units TID with meals ? ?Inpatient Diabetes Program Recommendations:   ? ?HbgA1C: Noted in Care Everywhere that A1C was 5.1% on 03/02/21 indicating an average glucose of 100 mg/dl over the past 2-3 months. Current A1C 8.8% on 05/11/21 indicating an average glucose of 206 mg/dl.  ? ?Outpatient DM medications: Patient was recently prescribed Tresiba on 05/10/21 after developing hyperglycemia with starting Medrol dose pack on 05/09/21. Prior to that, patient was not taking any DM medication outpatient. May want to consider discharging on DM medication.  If patient is discharged on Tresiba, would decrease dose to 5 units daily.  ? ?Thanks, ?Barnie Alderman, RN, MSN, CDE ?Diabetes Coordinator ?Inpatient Diabetes Program ?[phone] (Team Pager from 8am to 5pm) ? ?

## 2021-05-13 NOTE — TOC Transition Note (Signed)
Transition of Care (TOC) - CM/SW Discharge Note ? ? ?Patient Details  ?Name: Katrina Brady ?MRN: [id number] ?Date of Birth: [birth year] ? ?Transition of Care (TOC) CM/SW Contact:  ?Candie Chroman, LCSW ?Phone Number: ?05/13/2021, 11:49 AM ? ? ?Clinical Narrative:  Patient has orders to discharge home today. [employer] has arranged for shower stool to be shipped to patient's home. No further concerns. CSW signing off.  ? ?Final next level of care: Home/Self Care ?Barriers to Discharge: No Barriers Identified ? ? ?Patient Goals and CMS Choice ?  ?  ?  ? ?Discharge Placement ?  ?           ?  ?Patient to be transferred to facility by: Nephew's friend ?  ?Patient and family notified of of transfer: 05/13/21 ? ?Discharge Plan and Services ?  ?  ?Post Acute Care Choice: Durable Medical Equipment          ?DME Arranged: Shower stool ?DME Agency: [facility] ?Date DME Agency Contacted: 05/13/21 ?  ?Representative spoke with at DME Agency: Suanne Marker ?  ?  ?  ?  ?  ? ?Social Determinants of Health (SDOH) Interventions ?  ? ? ?Readmission Risk Interventions ? ?  05/13/2021  ? 10:13 AM  ?Readmission Risk Prevention Plan  ?Transportation Screening Complete  ?PCP or Specialist Appt within 3-5 Days Complete  ?Social Work Consult for [place] Planning/Counseling Complete  ?Palliative Care Screening Not Applicable  ?Medication Review Press photographer) Complete  ? ? ? ? ? ?

## 2021-11-15 DEATH — deceased

## 2022-11-01 IMAGING — CT CT ABD-PELV W/O CM
2 of 4 series · 16 of 46 positions shown, 18 images · non-contrast
Comparison: 04/15/2020 from [REDACTED].

CLINICAL DATA: Hyperglycemia. Abdominal pain. Presyncope.
Incontinent of diarrhea.



[Series 2: routine abd/pel wo · axial · 0.98mm/px · z∈[-1101,-621]mm · 13 of 106 slices shown, 15 images]
[im 5/106  soft-tissue]
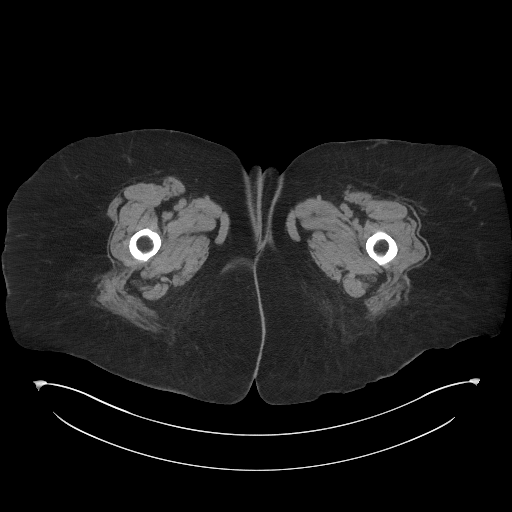
[im 5/106  bone]
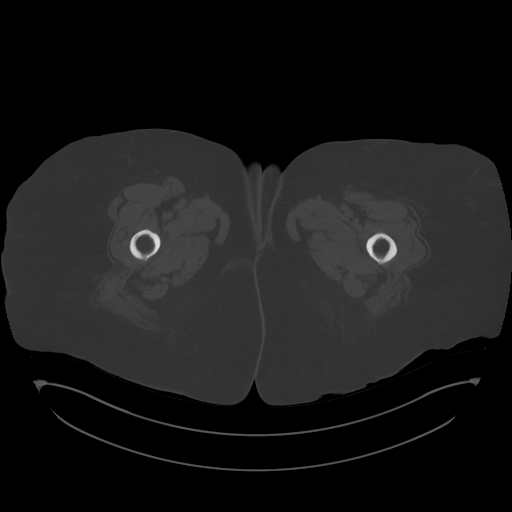
[im 14/106  soft-tissue]
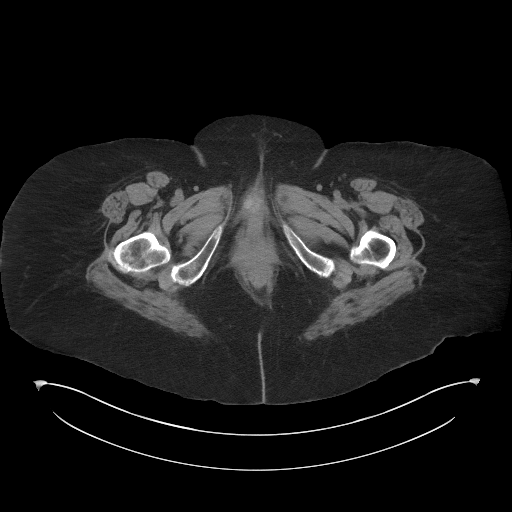
[im 23/106  soft-tissue]
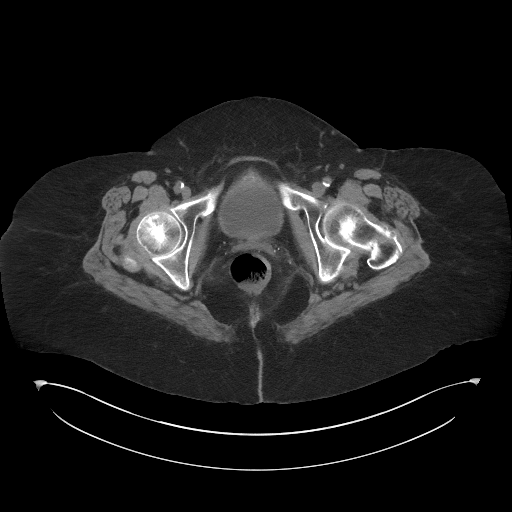
[im 28/106  soft-tissue]
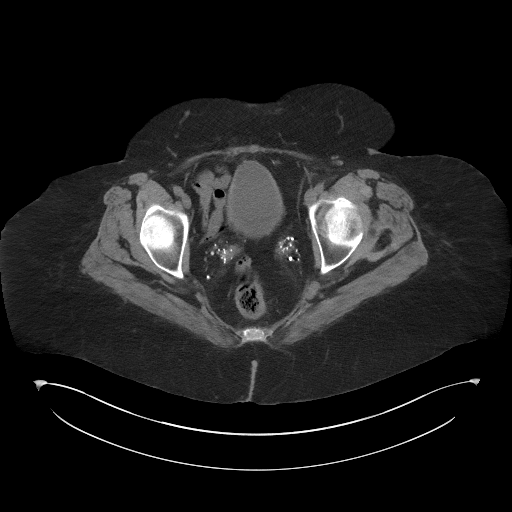
[im 37/106  soft-tissue]
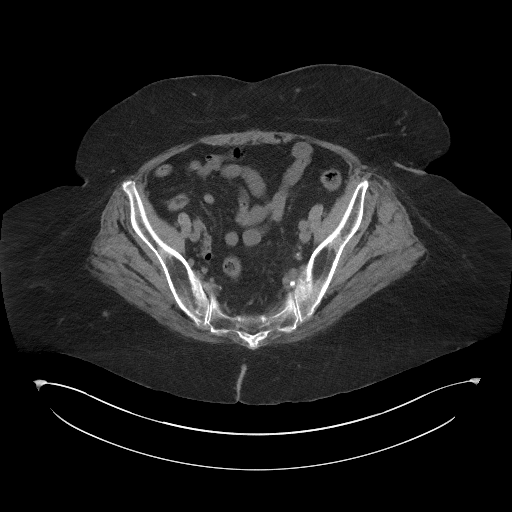
[im 46/106  soft-tissue]
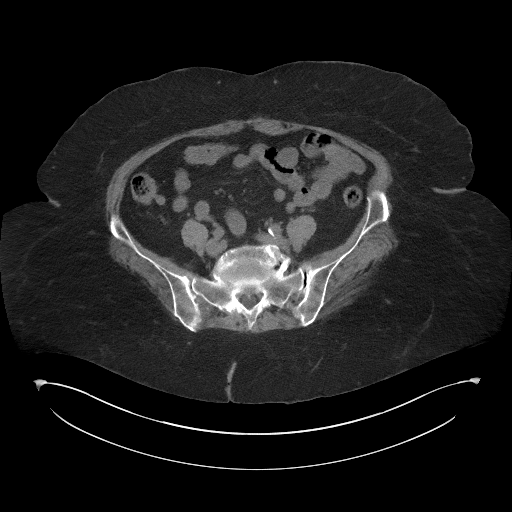
[im 55/106  soft-tissue]
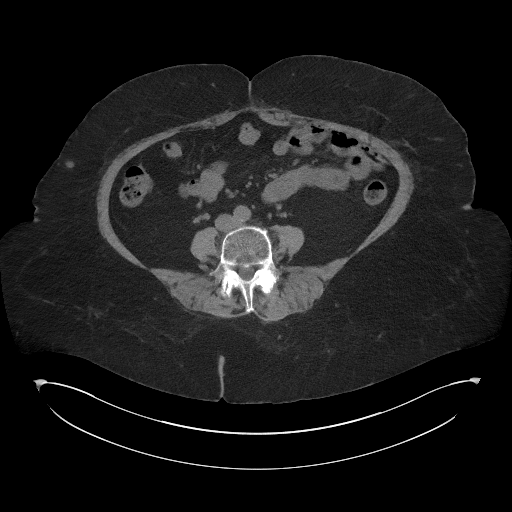
[im 60/106  soft-tissue]
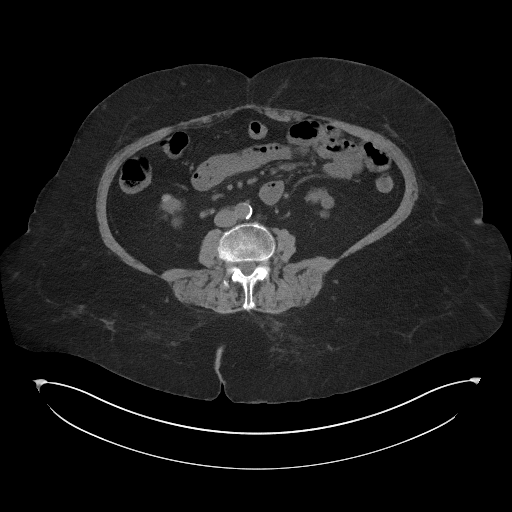
[im 69/106  soft-tissue]
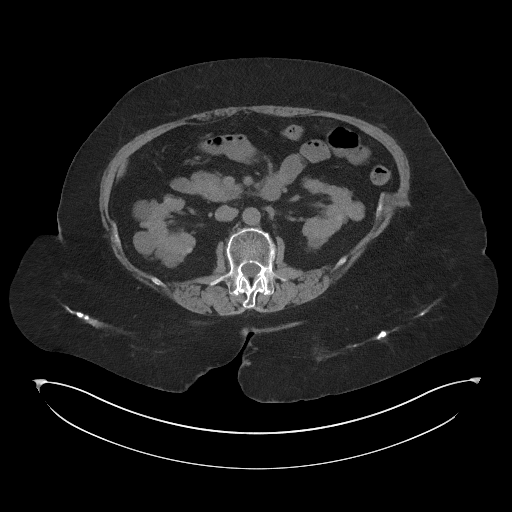
[im 69/106  bone]
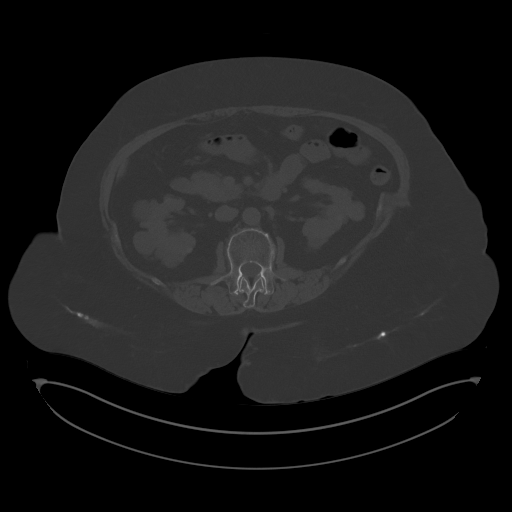
[im 78/106  soft-tissue]
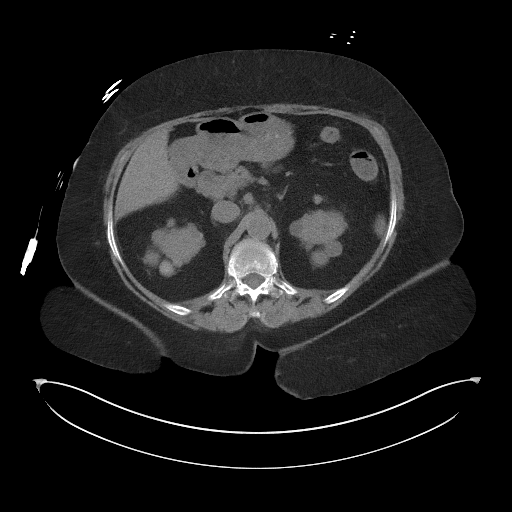
[im 83/106  soft-tissue]
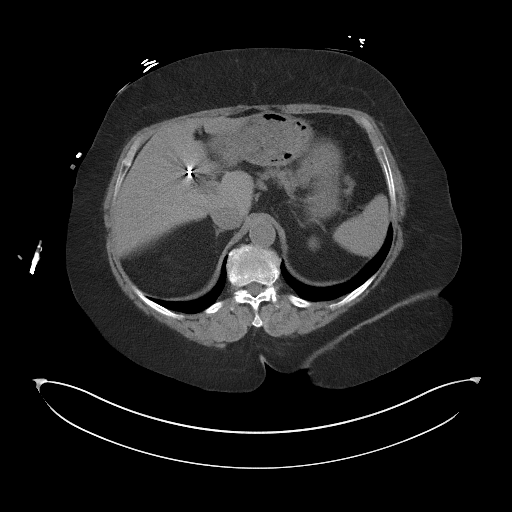
[im 92/106  soft-tissue]
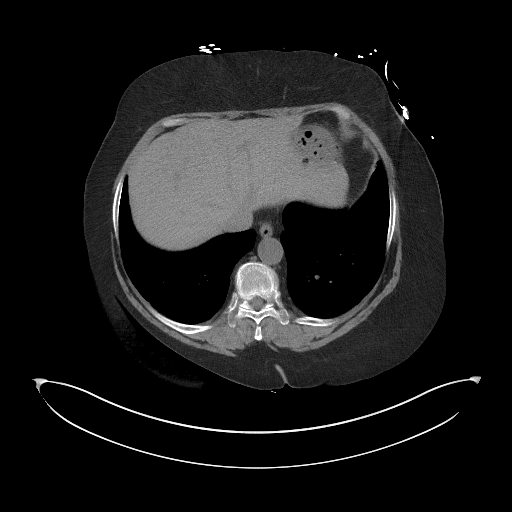
[im 101/106  soft-tissue]
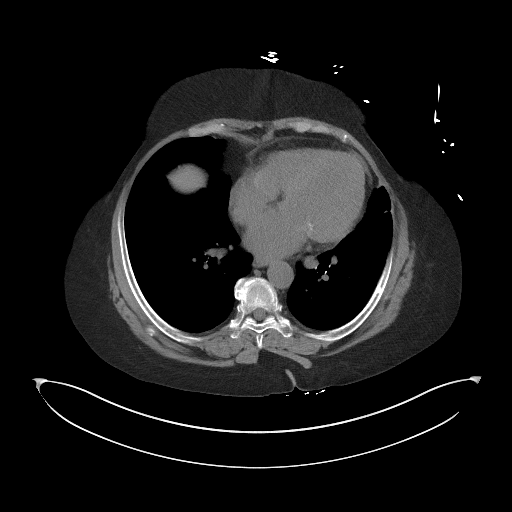

[Series 5: coronal st · coronal · 0.79mm/px · 3 of 119 slices shown]
[im 40/119  soft-tissue]
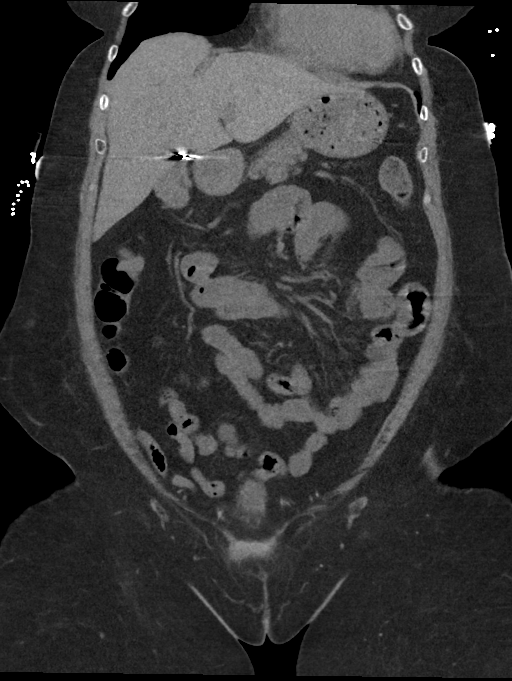
[im 53/119  soft-tissue]
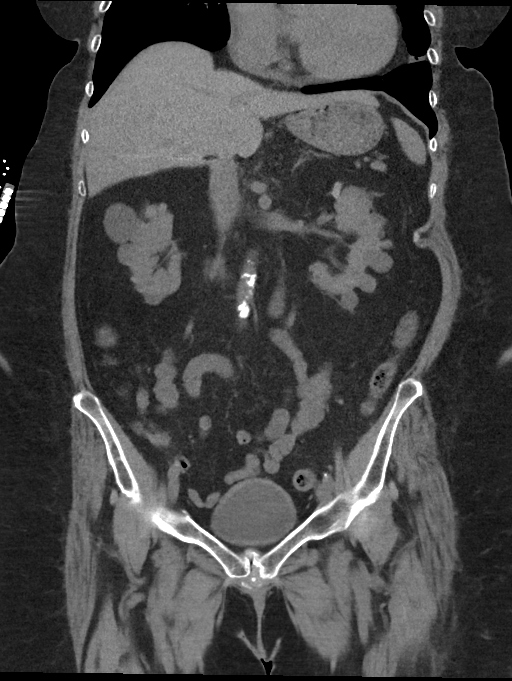
[im 66/119  soft-tissue]
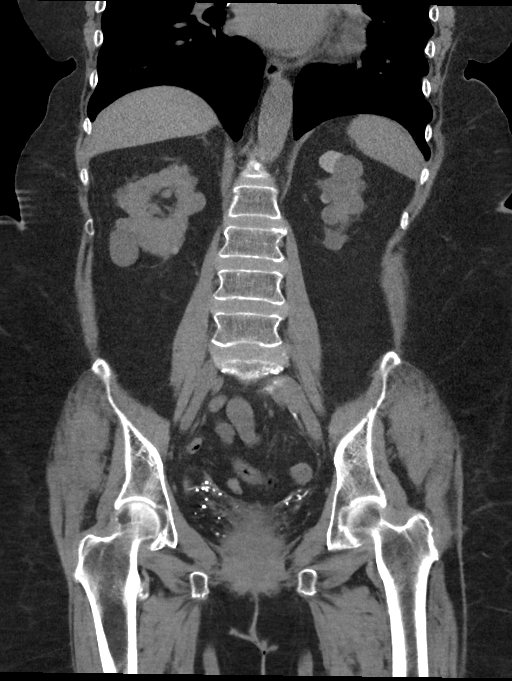

[16 of 46 positions shown; findings below may reference images not displayed]

FINDINGS: Lower chest: Left base scarring. Mild cardiomegaly, without
pericardial or pleural effusion.

Hepatobiliary: Normal liver. Cholecystectomy, without biliary ductal
dilatation.

Pancreas: Normal, without mass or ductal dilatation.

Spleen: Normal in size, without focal abnormality.

Adrenals/Urinary Tract: Normal adrenal glands. Innumerable small
renal lesions, many of which demonstrate complexity. These are
incompletely characterized on this noncontrast exam. No renal
calculi or hydronephrosis. No hydroureter or ureteric calculi. No
bladder calculi.

Stomach/Bowel: Normal stomach, without wall thickening. Normal colon
and terminal ileum. Normal small bowel.

Vascular/Lymphatic: Aortic atherosclerosis. No abdominopelvic
adenopathy.

Reproductive: Hysterectomy.  No adnexal mass.

Other: No significant free fluid. Pelvic floor laxity. No free
intraperitoneal air.

Musculoskeletal: Osteopenia.
IMPRESSION: 1.  No acute process in the abdomen or pelvis.
2. Innumerable renal lesions, many of which demonstrate complexity.
On this noncontrast exam, complex cysts versus solid neoplasm cannot
be differentiated. Consider nonemergent, outpatient pre and post
contrast abdominal MRI.
3.  Aortic Atherosclerosis ([id number]).
